# Patient Record
Sex: Male | Born: 1960 | Marital: Single | Smoking: Former smoker
Health system: Southern US, Community
[De-identification: ages and names within clinical notes are randomized; demographics above are authoritative.]

## PROBLEM LIST (undated history)

## (undated) DIAGNOSIS — I10 Essential (primary) hypertension: Secondary | ICD-10-CM

## (undated) DIAGNOSIS — Z8719 Personal history of other diseases of the digestive system: Secondary | ICD-10-CM

## (undated) DIAGNOSIS — D649 Anemia, unspecified: Secondary | ICD-10-CM

## (undated) DIAGNOSIS — K859 Acute pancreatitis without necrosis or infection, unspecified: Secondary | ICD-10-CM

## (undated) DIAGNOSIS — M1990 Unspecified osteoarthritis, unspecified site: Secondary | ICD-10-CM

## (undated) DIAGNOSIS — E119 Type 2 diabetes mellitus without complications: Secondary | ICD-10-CM

## (undated) HISTORY — PX: CARPAL TUNNEL RELEASE: SHX101

---

## 2008-08-12 HISTORY — PX: EYE SURGERY: SHX253

## 2011-08-13 HISTORY — PX: REPLACEMENT TOTAL KNEE: SUR1224

## 2012-08-12 HISTORY — PX: FINGER SURGERY: SHX640

## 2012-08-12 HISTORY — PX: ELBOW SURGERY: SHX618

## 2012-08-12 HISTORY — PX: KNEE ARTHROSCOPY: SUR90

## 2013-08-12 DIAGNOSIS — K859 Acute pancreatitis without necrosis or infection, unspecified: Secondary | ICD-10-CM

## 2013-08-12 HISTORY — DX: Acute pancreatitis without necrosis or infection, unspecified: K85.90

## 2015-02-20 ENCOUNTER — Other Ambulatory Visit: Payer: Self-pay | Admitting: Orthopedic Surgery

## 2015-02-20 DIAGNOSIS — M545 Low back pain: Secondary | ICD-10-CM

## 2015-02-22 ENCOUNTER — Other Ambulatory Visit: Payer: Self-pay | Admitting: Orthopedic Surgery

## 2015-02-22 DIAGNOSIS — M545 Low back pain: Secondary | ICD-10-CM

## 2015-03-01 ENCOUNTER — Ambulatory Visit
Admission: RE | Admit: 2015-03-01 | Discharge: 2015-03-01 | Disposition: A | Discharge status: Home / Self Care | Payer: Medicare Other | Source: Ambulatory Visit | Attending: Orthopedic Surgery | Admitting: Orthopedic Surgery

## 2015-03-01 DIAGNOSIS — M545 Low back pain: Secondary | ICD-10-CM

## 2015-03-07 ENCOUNTER — Ambulatory Visit
Admission: RE | Admit: 2015-03-07 | Discharge: 2015-03-07 | Disposition: A | Discharge status: Home / Self Care | Payer: Medicare Other | Source: Ambulatory Visit | Attending: Orthopedic Surgery | Admitting: Orthopedic Surgery

## 2015-03-07 DIAGNOSIS — M545 Low back pain: Secondary | ICD-10-CM

## 2015-03-07 MED ORDER — GADOBENATE DIMEGLUMINE 529 MG/ML IV SOLN
20.0000 mL | Freq: Once | INTRAVENOUS | Status: AC | PRN
  Administered 2015-03-07: 20 mL via INTRAVENOUS

## 2015-03-13 ENCOUNTER — Other Ambulatory Visit: Payer: Self-pay | Admitting: Orthopedic Surgery

## 2015-03-14 ENCOUNTER — Other Ambulatory Visit: Payer: Self-pay | Admitting: Orthopedic Surgery

## 2015-03-17 ENCOUNTER — Encounter (HOSPITAL_COMMUNITY): Payer: Self-pay

## 2015-03-17 ENCOUNTER — Ambulatory Visit (HOSPITAL_COMMUNITY)
Admission: RE | Admit: 2015-03-17 | Discharge: 2015-03-17 | Disposition: A | Discharge status: Home / Self Care | Payer: Medicare Other | Source: Ambulatory Visit | Attending: Orthopedic Surgery | Admitting: Orthopedic Surgery

## 2015-03-17 ENCOUNTER — Encounter (HOSPITAL_COMMUNITY)
Admission: RE | Admit: 2015-03-17 | Discharge: 2015-03-17 | Disposition: A | Discharge status: Home / Self Care | Payer: Medicare Other | Source: Ambulatory Visit | Attending: Orthopedic Surgery | Admitting: Orthopedic Surgery

## 2015-03-17 DIAGNOSIS — Z01818 Encounter for other preprocedural examination: Secondary | ICD-10-CM | POA: Diagnosis present

## 2015-03-17 DIAGNOSIS — Z01812 Encounter for preprocedural laboratory examination: Secondary | ICD-10-CM | POA: Insufficient documentation

## 2015-03-17 DIAGNOSIS — M5136 Other intervertebral disc degeneration, lumbar region: Secondary | ICD-10-CM | POA: Diagnosis not present

## 2015-03-17 DIAGNOSIS — M4316 Spondylolisthesis, lumbar region: Secondary | ICD-10-CM | POA: Insufficient documentation

## 2015-03-17 DIAGNOSIS — Z79899 Other long term (current) drug therapy: Secondary | ICD-10-CM | POA: Insufficient documentation

## 2015-03-17 DIAGNOSIS — R001 Bradycardia, unspecified: Secondary | ICD-10-CM | POA: Diagnosis not present

## 2015-03-17 DIAGNOSIS — I10 Essential (primary) hypertension: Secondary | ICD-10-CM | POA: Diagnosis not present

## 2015-03-17 DIAGNOSIS — Z87891 Personal history of nicotine dependence: Secondary | ICD-10-CM | POA: Diagnosis not present

## 2015-03-17 DIAGNOSIS — E119 Type 2 diabetes mellitus without complications: Secondary | ICD-10-CM | POA: Insufficient documentation

## 2015-03-17 DIAGNOSIS — Z0183 Encounter for blood typing: Secondary | ICD-10-CM | POA: Diagnosis not present

## 2015-03-17 HISTORY — DX: Essential (primary) hypertension: I10

## 2015-03-17 HISTORY — DX: Personal history of other diseases of the digestive system: Z87.19

## 2015-03-17 HISTORY — DX: Acute pancreatitis without necrosis or infection, unspecified: K85.90

## 2015-03-17 HISTORY — DX: Unspecified osteoarthritis, unspecified site: M19.90

## 2015-03-17 HISTORY — DX: Type 2 diabetes mellitus without complications: E11.9

## 2015-03-17 HISTORY — DX: Anemia, unspecified: D64.9

## 2015-03-17 LAB — TYPE AND SCREEN
ABO/RH(D): O POS
Antibody Screen: NEGATIVE

## 2015-03-17 LAB — CBC WITH DIFFERENTIAL/PLATELET
BASOS ABS: 0.1 10*3/uL (ref 0.0–0.1)
Basophils Relative: 1 % (ref 0–1)
Eosinophils Absolute: 0.3 10*3/uL (ref 0.0–0.7)
Eosinophils Relative: 4 % (ref 0–5)
HEMATOCRIT: 43.5 % (ref 39.0–52.0)
Hemoglobin: 14.7 g/dL (ref 13.0–17.0)
LYMPHS ABS: 1.3 10*3/uL (ref 0.7–4.0)
Lymphocytes Relative: 22 % (ref 12–46)
MCH: 34.7 pg — AB (ref 26.0–34.0)
MCHC: 33.8 g/dL (ref 30.0–36.0)
MCV: 102.6 fL — ABNORMAL HIGH (ref 78.0–100.0)
MONO ABS: 0.9 10*3/uL (ref 0.1–1.0)
MONOS PCT: 14 % — AB (ref 3–12)
Neutro Abs: 3.5 10*3/uL (ref 1.7–7.7)
Neutrophils Relative %: 59 % (ref 43–77)
Platelets: 194 10*3/uL (ref 150–400)
RBC: 4.24 MIL/uL (ref 4.22–5.81)
RDW: 13.3 % (ref 11.5–15.5)
WBC: 6.1 10*3/uL (ref 4.0–10.5)

## 2015-03-17 LAB — URINALYSIS, ROUTINE W REFLEX MICROSCOPIC
BILIRUBIN URINE: NEGATIVE
GLUCOSE, UA: NEGATIVE mg/dL
Hgb urine dipstick: NEGATIVE
KETONES UR: NEGATIVE mg/dL
LEUKOCYTES UA: NEGATIVE
NITRITE: NEGATIVE
PH: 5.5 (ref 5.0–8.0)
Protein, ur: NEGATIVE mg/dL
Specific Gravity, Urine: 1.014 (ref 1.005–1.030)
UROBILINOGEN UA: 0.2 mg/dL (ref 0.0–1.0)

## 2015-03-17 LAB — COMPREHENSIVE METABOLIC PANEL
ALT: 29 U/L (ref 17–63)
AST: 69 U/L — ABNORMAL HIGH (ref 15–41)
Albumin: 4 g/dL (ref 3.5–5.0)
Alkaline Phosphatase: 84 U/L (ref 38–126)
Anion gap: 12 (ref 5–15)
BUN: 11 mg/dL (ref 6–20)
CALCIUM: 9.5 mg/dL (ref 8.9–10.3)
CHLORIDE: 105 mmol/L (ref 101–111)
CO2: 20 mmol/L — AB (ref 22–32)
Creatinine, Ser: 1.45 mg/dL — ABNORMAL HIGH (ref 0.61–1.24)
GFR calc Af Amer: >60 mL/min (ref >60)
GFR, EST NON AFRICAN AMERICAN: 54 mL/min — AB (ref >60)
Glucose, Bld: 133 mg/dL — ABNORMAL HIGH (ref 65–99)
POTASSIUM: 6 mmol/L — AB (ref 3.5–5.1)
Sodium: 137 mmol/L (ref 135–145)
Total Bilirubin: 1 mg/dL (ref 0.3–1.2)
Total Protein: 8 g/dL (ref 6.5–8.1)

## 2015-03-17 LAB — SURGICAL PCR SCREEN
MRSA, PCR: NEGATIVE
Staphylococcus aureus: NEGATIVE

## 2015-03-17 LAB — GLUCOSE, CAPILLARY: GLUCOSE-CAPILLARY: 141 mg/dL — AB (ref 65–99)

## 2015-03-17 LAB — PROTIME-INR
INR: 1.22 (ref 0.00–1.49)
Prothrombin Time: 15.6 seconds — ABNORMAL HIGH (ref 11.6–15.2)

## 2015-03-17 LAB — ABO/RH: ABO/RH(D): O POS

## 2015-03-17 LAB — APTT: aPTT: 27 seconds (ref 24–37)

## 2015-03-17 NOTE — Progress Notes (Signed)
   03/17/15 1210  OBSTRUCTIVE SLEEP APNEA  Have you ever been diagnosed with sleep apnea through a sleep study? No  Do you snore loudly (loud enough to be heard through closed doors)?  1  Do you often feel tired, fatigued, or sleepy during the daytime? 0  Has anyone observed you stop breathing during your sleep? 0  Do you have, or are you being treated for high blood pressure? 1  BMI more than 35 kg/m2? 1  Age over 52 years old? 1  Neck circumference greater than 40 cm/16 inches? 1  Gender: 1

## 2015-03-17 NOTE — Progress Notes (Addendum)
PCP: [facility] in [place] ,[place]  Faxed over Sleep apnea screening tool.  No cardiologist. States fasting blood sugars are 120's.  2015 stress test at [facility], negative,dx. with pancreatitis-resolved --will request test/ekg

## 2015-03-17 NOTE — Progress Notes (Addendum)
Anesthesia Chart Review:  Pt is 54 year old male scheduled for L4-5, L5-S1 transforaminal lumbar interbody fusion, L4-5, L5S1 decompression on 03/22/2015 with Dr. Yevette Edwards.   PMH includes: HTN, DM, pancreatitis, anemia. Former smoker. BMI 37.5.   Medications include: amlodipine, atenolol, crestor, levemir, losartan, protonix.   Preoperative labs reviewed.  K 6.0. Notified Carla in Dr. Marshell Levan office.    Chest x-ray 03/17/2015 reviewed. No active cardiopulmonary disease.   EKG 03/17/2015: sinus bradycardia (57 bpm).   Pt reportedly had stress test at [facility] in 2015. Attempting to get records.   Rica Mast, FNP-BC [facility]/Anesthesiology Phone: [phone] 03/17/2015 4:57 PM  Addendum: Dr. Yevette Edwards had pt repeat BMET today (8/8). K is now 4.5. Cr is 1.6. Lab results forwarded to Dr. Yevette Edwards for review and Albin Felling in Dr. Marshell Levan office notified.   Rica Mast, FNP-BC [facility] Stay [facility]/Anesthesiology Phone: [phone] 03/20/2015 1:52 PM  Addendum: Nuclear stress test 01/26/2013 (see care everywhere; printed copy on paper chart): 1. No scintigraphic evidence of prior infarction or pharmacologically-induced ischemia 2. Normal wall motion. EF 73%  If no changes, I anticipate pt can proceed with surgery as scheduled.   Rica Mast, FNP-BC [facility] Stay Surgical Center/Anesthesiology Phone: [phone] 03/21/2015 4:36 PM

## 2015-03-17 NOTE — Pre-Procedure Instructions (Signed)
Alec Gibbs  03/17/2015      [id number] - [place], [place] - [address] AT [facility] [address] [place] [address] Phone: [phone] Fax: [phone]    Your procedure is scheduled on Wednesday, Aug. 10  Report to [facility] Main Entrance "A" at 6:30 A.M.  Call this number if you have problems the morning of surgery:  [phone]              Any questions prior to surgery call [phone] Monday-Friday 8am-4pm   Remember:  Do not eat food or drink liquids after midnight on Tuesday 8/09  Take these medicines the morning of surgery with A SIP OF WATER: allourinol, norvasc, atenolol (tenormin), colchicine, oxycodone if needed, protonix  How to Manage Your Diabetes Before Surgery   Why is it important to control my blood sugar before and after surgery?   Improving blood sugar levels before and after surgery helps healing and can limit problems.  A way of improving blood sugar control is eating a healthy diet by:  - Eating less sugar and carbohydrates  - Increasing activity/exercise  - Talk with your doctor about reaching your blood sugar goals  High blood sugars (greater than 180 mg/dL) can raise your risk of infections and slow down your recovery so you will need to focus on controlling your diabetes during the weeks before surgery.  Make sure that the doctor who takes care of your diabetes knows about your planned surgery including the date and location.  How do I manage my blood sugars before surgery?   Check your blood sugar at least 4 times a day, 2 days before surgery to make sure that they are not too high or low.    Check your blood sugar the morning of your surgery when you wake up and every 2               hours until you get to the Short-Stay unit.   Treat a low blood sugar (less than 70 mg/dL) with 1/2 cup of clear juice (cranberry or apple), 4 glucose tablets, OR glucose gel.   Recheck blood sugar in 15  minutes after treatment (to make sure it is greater than 70 mg/dL).  If blood sugar is not greater than 70 mg/dL on re-check, call [phone] for further instructions.    Report your blood sugar to the Short-Stay nurse when you get to Short-Stay.  References:  [facility], 2007 "How to Manage your Diabetes Before and After Surgery".  What do I do about my diabetes medications?    THE NIGHT BEFORE SURGERY, take 16 units of levemir  Insulin    Do not wear jewelry, make-up or nail polish.  Do not wear lotions, powders, or perfumes.  You may wear deodorant.  Do not shave 48 hours prior to surgery.  Men may shave face and neck.  Do not bring valuables to the hospital.  [facility] is not responsible for any belongings or valuables.  Contacts, dentures or bridgework may not be worn into surgery.  Leave your suitcase in the car.  After surgery it may be brought to your room.  For patients admitted to the hospital, discharge time will be determined by your treatment team.  Patients discharged the day of surgery will not be allowed to drive home.   Name and phone number of your driver:    Special instructions:  Review  preparing for surgery handout  Please read over the following fact sheets that you were given. Pain Booklet, Coughing and Deep Breathing, Blood Transfusion Information, MRSA Information and Surgical Site Infection Prevention

## 2015-03-18 LAB — HEMOGLOBIN A1C
Hgb A1c MFr Bld: 7.9 % — ABNORMAL HIGH (ref 4.8–5.6)
MEAN PLASMA GLUCOSE: 180 mg/dL

## 2015-03-20 ENCOUNTER — Inpatient Hospital Stay (HOSPITAL_COMMUNITY): Admission: RE | Admit: 2015-03-20 | Payer: Medicare Other | Source: Ambulatory Visit

## 2015-03-20 LAB — BASIC METABOLIC PANEL
Anion gap: 12 (ref 5–15)
BUN: 17 mg/dL (ref 6–20)
CO2: 20 mmol/L — ABNORMAL LOW (ref 22–32)
Calcium: 9.3 mg/dL (ref 8.9–10.3)
Chloride: 104 mmol/L (ref 101–111)
Creatinine, Ser: 1.6 mg/dL — ABNORMAL HIGH (ref 0.61–1.24)
GFR calc non Af Amer: 48 mL/min — ABNORMAL LOW (ref >60)
GFR, EST AFRICAN AMERICAN: 56 mL/min — AB (ref >60)
Glucose, Bld: 139 mg/dL — ABNORMAL HIGH (ref 65–99)
Potassium: 4.5 mmol/L (ref 3.5–5.1)
SODIUM: 136 mmol/L (ref 135–145)

## 2015-03-21 ENCOUNTER — Other Ambulatory Visit: Payer: Self-pay | Admitting: Orthopedic Surgery

## 2015-03-21 MED ORDER — CEFAZOLIN SODIUM 10 G IJ SOLR
3.0000 g | INTRAMUSCULAR | Status: AC
  Administered 2015-03-22 (×2): 3 g via INTRAVENOUS
  Filled 2015-03-21: qty 3000

## 2015-03-22 ENCOUNTER — Encounter (HOSPITAL_COMMUNITY)
Admission: AD | Disposition: A | Discharge status: Home / Self Care | Payer: Medicare Other | Source: Ambulatory Visit | Attending: Orthopedic Surgery

## 2015-03-22 ENCOUNTER — Inpatient Hospital Stay (HOSPITAL_COMMUNITY): Payer: Medicare Other

## 2015-03-22 ENCOUNTER — Inpatient Hospital Stay (HOSPITAL_COMMUNITY): Payer: Medicare Other | Admitting: Emergency Medicine

## 2015-03-22 ENCOUNTER — Inpatient Hospital Stay (HOSPITAL_COMMUNITY): Payer: Medicare Other | Admitting: Anesthesiology

## 2015-03-22 ENCOUNTER — Inpatient Hospital Stay (HOSPITAL_COMMUNITY)
Admission: AD | Admit: 2015-03-22 | Discharge: 2015-03-24 | DRG: 460 | Disposition: A | Discharge status: Home / Self Care | Payer: Medicare Other | Source: Ambulatory Visit | Attending: Orthopedic Surgery | Admitting: Orthopedic Surgery

## 2015-03-22 ENCOUNTER — Encounter (HOSPITAL_COMMUNITY): Payer: Self-pay | Admitting: Anesthesiology

## 2015-03-22 DIAGNOSIS — M79606 Pain in leg, unspecified: Secondary | ICD-10-CM | POA: Diagnosis present

## 2015-03-22 DIAGNOSIS — M62838 Other muscle spasm: Secondary | ICD-10-CM | POA: Diagnosis not present

## 2015-03-22 DIAGNOSIS — N365 Urethral false passage: Secondary | ICD-10-CM | POA: Diagnosis present

## 2015-03-22 DIAGNOSIS — Z96652 Presence of left artificial knee joint: Secondary | ICD-10-CM | POA: Diagnosis present

## 2015-03-22 DIAGNOSIS — I10 Essential (primary) hypertension: Secondary | ICD-10-CM | POA: Diagnosis present

## 2015-03-22 DIAGNOSIS — M4317 Spondylolisthesis, lumbosacral region: Secondary | ICD-10-CM | POA: Diagnosis present

## 2015-03-22 DIAGNOSIS — Z87891 Personal history of nicotine dependence: Secondary | ICD-10-CM | POA: Diagnosis not present

## 2015-03-22 DIAGNOSIS — M5410 Radiculopathy, site unspecified: Secondary | ICD-10-CM | POA: Diagnosis present

## 2015-03-22 DIAGNOSIS — N359 Urethral stricture, unspecified: Secondary | ICD-10-CM | POA: Diagnosis present

## 2015-03-22 DIAGNOSIS — Z79899 Other long term (current) drug therapy: Secondary | ICD-10-CM | POA: Diagnosis not present

## 2015-03-22 DIAGNOSIS — Z794 Long term (current) use of insulin: Secondary | ICD-10-CM

## 2015-03-22 DIAGNOSIS — E119 Type 2 diabetes mellitus without complications: Secondary | ICD-10-CM | POA: Diagnosis present

## 2015-03-22 DIAGNOSIS — G9589 Other specified diseases of spinal cord: Secondary | ICD-10-CM | POA: Diagnosis present

## 2015-03-22 DIAGNOSIS — M4807 Spinal stenosis, lumbosacral region: Principal | ICD-10-CM | POA: Diagnosis present

## 2015-03-22 DIAGNOSIS — Z419 Encounter for procedure for purposes other than remedying health state, unspecified: Secondary | ICD-10-CM

## 2015-03-22 HISTORY — PX: CYSTOSCOPY: SHX5120

## 2015-03-22 HISTORY — PX: CYSTOSCOPY WITH URETHRAL DILATATION: SHX5125

## 2015-03-22 LAB — GLUCOSE, CAPILLARY
Glucose-Capillary: 140 mg/dL — ABNORMAL HIGH (ref 65–99)
Glucose-Capillary: 127 mg/dL — ABNORMAL HIGH (ref 65–99)
Glucose-Capillary: 137 mg/dL — ABNORMAL HIGH (ref 65–99)

## 2015-03-22 SURGERY — POSTERIOR LUMBAR FUSION 1 LEVEL
Anesthesia: General | Site: Urethra | Laterality: Left

## 2015-03-22 MED ORDER — FENTANYL CITRATE (PF) 250 MCG/5ML IJ SOLN
INTRAMUSCULAR | Status: AC
  Filled 2015-03-22: qty 5

## 2015-03-22 MED ORDER — ATENOLOL 100 MG PO TABS
100.0000 mg | ORAL_TABLET | Freq: Every day | ORAL | Status: DC
  Administered 2015-03-23: 100 mg via ORAL
  Filled 2015-03-22 (×2): qty 1

## 2015-03-22 MED ORDER — PROMETHAZINE HCL 25 MG/ML IJ SOLN: 6.2500 mg | INTRAMUSCULAR | Status: DC | PRN

## 2015-03-22 MED ORDER — LOSARTAN POTASSIUM 25 MG PO TABS
25.0000 mg | ORAL_TABLET | Freq: Every day | ORAL | Status: DC
  Administered 2015-03-23: 25 mg via ORAL
  Filled 2015-03-22 (×2): qty 1

## 2015-03-22 MED ORDER — SODIUM CHLORIDE 0.9 % IJ SOLN
3.0000 mL | Freq: Two times a day (BID) | INTRAMUSCULAR | Status: DC
  Administered 2015-03-22: 3 mL via INTRAVENOUS

## 2015-03-22 MED ORDER — ALBUMIN HUMAN 5 % IV SOLN
INTRAVENOUS | Status: DC | PRN
  Administered 2015-03-22 (×2): via INTRAVENOUS

## 2015-03-22 MED ORDER — EPHEDRINE SULFATE 50 MG/ML IJ SOLN
INTRAMUSCULAR | Status: AC
  Filled 2015-03-22: qty 1

## 2015-03-22 MED ORDER — ALUM & MAG HYDROXIDE-SIMETH 200-200-20 MG/5ML PO SUSP: 30.0000 mL | Freq: Four times a day (QID) | ORAL | Status: DC | PRN

## 2015-03-22 MED ORDER — THROMBIN 20000 UNITS EX KIT
PACK | CUTANEOUS | Status: DC | PRN
  Administered 2015-03-22: 20000 [IU] via TOPICAL

## 2015-03-22 MED ORDER — MORPHINE SULFATE 2 MG/ML IJ SOLN: 1.0000 mg | INTRAMUSCULAR | Status: DC | PRN

## 2015-03-22 MED ORDER — SUCCINYLCHOLINE CHLORIDE 20 MG/ML IJ SOLN
INTRAMUSCULAR | Status: AC
  Filled 2015-03-22: qty 1

## 2015-03-22 MED ORDER — LIDOCAINE HCL (CARDIAC) 20 MG/ML IV SOLN
INTRAVENOUS | Status: AC
  Filled 2015-03-22: qty 5

## 2015-03-22 MED ORDER — LIDOCAINE HCL (CARDIAC) 20 MG/ML IV SOLN
INTRAVENOUS | Status: DC | PRN
  Administered 2015-03-22: 60 mg via INTRAVENOUS

## 2015-03-22 MED ORDER — SODIUM CHLORIDE 0.9 % IV SOLN
INTRAVENOUS | Status: DC
  Administered 2015-03-22: 75 mL/h via INTRAVENOUS

## 2015-03-22 MED ORDER — SODIUM CHLORIDE 0.9 % IV SOLN
INTRAVENOUS | Status: DC | PRN
  Administered 2015-03-22: 15:00:00 via INTRAVENOUS

## 2015-03-22 MED ORDER — ALLOPURINOL 100 MG PO TABS
100.0000 mg | ORAL_TABLET | Freq: Three times a day (TID) | ORAL | Status: DC
  Filled 2015-03-22 (×8): qty 1

## 2015-03-22 MED ORDER — ROSUVASTATIN CALCIUM 40 MG PO TABS
40.0000 mg | ORAL_TABLET | Freq: Every day | ORAL | Status: DC
Start: 2015-03-23 — End: 2015-03-24
  Filled 2015-03-22 (×2): qty 1
  Filled 2015-03-22: qty 2

## 2015-03-22 MED ORDER — POVIDONE-IODINE 7.5 % EX SOLN: Freq: Once | CUTANEOUS | Status: DC

## 2015-03-22 MED ORDER — COLCHICINE 0.6 MG PO TABS
0.6000 mg | ORAL_TABLET | Freq: Every day | ORAL | Status: DC
  Administered 2015-03-23: 0.6 mg via ORAL
  Filled 2015-03-22 (×2): qty 1

## 2015-03-22 MED ORDER — HYDROMORPHONE HCL 1 MG/ML IJ SOLN
0.2500 mg | INTRAMUSCULAR | Status: DC | PRN
  Administered 2015-03-22 (×2): 0.5 mg via INTRAVENOUS

## 2015-03-22 MED ORDER — 0.9 % SODIUM CHLORIDE (POUR BTL) OPTIME
TOPICAL | Status: DC | PRN
  Administered 2015-03-22: 4000 mL

## 2015-03-22 MED ORDER — ACETAMINOPHEN 325 MG PO TABS: 650.0000 mg | ORAL_TABLET | ORAL | Status: DC | PRN

## 2015-03-22 MED ORDER — ARTIFICIAL TEARS OP OINT
TOPICAL_OINTMENT | OPHTHALMIC | Status: DC | PRN
  Administered 2015-03-22: 1 via OPHTHALMIC

## 2015-03-22 MED ORDER — BISACODYL 5 MG PO TBEC
5.0000 mg | DELAYED_RELEASE_TABLET | Freq: Every day | ORAL | Status: DC | PRN
Start: 2015-03-22 — End: 2015-03-24
  Administered 2015-03-24: 5 mg via ORAL
  Filled 2015-03-22: qty 1

## 2015-03-22 MED ORDER — DEXTROSE 5 % IV SOLN
3.0000 g | INTRAVENOUS | Status: DC
  Filled 2015-03-22: qty 3000

## 2015-03-22 MED ORDER — AMLODIPINE BESYLATE 10 MG PO TABS
10.0000 mg | ORAL_TABLET | Freq: Every day | ORAL | Status: DC
  Filled 2015-03-22 (×2): qty 1

## 2015-03-22 MED ORDER — DOCUSATE SODIUM 100 MG PO CAPS
100.0000 mg | ORAL_CAPSULE | Freq: Two times a day (BID) | ORAL | Status: DC
  Administered 2015-03-22 – 2015-03-23 (×3): 100 mg via ORAL
  Filled 2015-03-22 (×2): qty 1

## 2015-03-22 MED ORDER — IOHEXOL 300 MG/ML  SOLN
INTRAMUSCULAR | Status: DC | PRN
  Administered 2015-03-22: 25 mL via URETHRAL

## 2015-03-22 MED ORDER — OXYCODONE-ACETAMINOPHEN 5-325 MG PO TABS
1.0000 | ORAL_TABLET | ORAL | Status: DC | PRN
  Administered 2015-03-22 – 2015-03-24 (×6): 2 via ORAL
  Filled 2015-03-22 (×6): qty 2

## 2015-03-22 MED ORDER — PROPOFOL INFUSION 10 MG/ML OPTIME
INTRAVENOUS | Status: DC | PRN
  Administered 2015-03-22: 50 ug/kg/min via INTRAVENOUS

## 2015-03-22 MED ORDER — HYDROMORPHONE HCL 1 MG/ML IJ SOLN
INTRAMUSCULAR | Status: AC
  Administered 2015-03-22: 0.5 mg via INTRAVENOUS
  Filled 2015-03-22: qty 1

## 2015-03-22 MED ORDER — VECURONIUM BROMIDE 10 MG IV SOLR
INTRAVENOUS | Status: DC | PRN
  Administered 2015-03-22 (×2): 2000 ug via INTRAVENOUS
  Administered 2015-03-22: 4000 ug via INTRAVENOUS
  Administered 2015-03-22 (×2): 2000 ug via INTRAVENOUS

## 2015-03-22 MED ORDER — SUCCINYLCHOLINE CHLORIDE 20 MG/ML IJ SOLN
INTRAMUSCULAR | Status: DC | PRN
  Administered 2015-03-22: 120 mg via INTRAVENOUS

## 2015-03-22 MED ORDER — OXYCODONE HCL 5 MG PO TABS: 5.0000 mg | ORAL_TABLET | Freq: Once | ORAL | Status: DC | PRN

## 2015-03-22 MED ORDER — DIAZEPAM 5 MG PO TABS
5.0000 mg | ORAL_TABLET | Freq: Four times a day (QID) | ORAL | Status: DC | PRN
  Administered 2015-03-22 – 2015-03-23 (×2): 5 mg via ORAL
  Filled 2015-03-22 (×3): qty 1

## 2015-03-22 MED ORDER — DEXTROSE 5 % IV SOLN
INTRAVENOUS | Status: DC | PRN
  Administered 2015-03-22: 11:00:00 via INTRAVENOUS

## 2015-03-22 MED ORDER — ACETAMINOPHEN 650 MG RE SUPP: 650.0000 mg | RECTAL | Status: DC | PRN

## 2015-03-22 MED ORDER — BUPIVACAINE-EPINEPHRINE (PF) 0.25% -1:200000 IJ SOLN
INTRAMUSCULAR | Status: AC
  Filled 2015-03-22: qty 30

## 2015-03-22 MED ORDER — METHYLENE BLUE 1 % INJ SOLN
INTRAMUSCULAR | Status: AC
  Filled 2015-03-22: qty 10

## 2015-03-22 MED ORDER — FLEET ENEMA 7-19 GM/118ML RE ENEM: 1.0000 | ENEMA | Freq: Once | RECTAL | Status: DC | PRN

## 2015-03-22 MED ORDER — FENTANYL CITRATE (PF) 100 MCG/2ML IJ SOLN
INTRAMUSCULAR | Status: DC | PRN
  Administered 2015-03-22 (×3): 50 ug via INTRAVENOUS
  Administered 2015-03-22: 150 ug via INTRAVENOUS
  Administered 2015-03-22: 100 ug via INTRAVENOUS
  Administered 2015-03-22 (×2): 50 ug via INTRAVENOUS

## 2015-03-22 MED ORDER — ARTIFICIAL TEARS OP OINT
TOPICAL_OINTMENT | OPHTHALMIC | Status: AC
  Filled 2015-03-22: qty 3.5

## 2015-03-22 MED ORDER — EPHEDRINE SULFATE 50 MG/ML IJ SOLN
INTRAMUSCULAR | Status: DC | PRN
  Administered 2015-03-22: 20 mg via INTRAVENOUS

## 2015-03-22 MED ORDER — VECURONIUM BROMIDE 10 MG IV SOLR
INTRAVENOUS | Status: AC
  Filled 2015-03-22: qty 20

## 2015-03-22 MED ORDER — SODIUM CHLORIDE 0.9 % IJ SOLN: 3.0000 mL | INTRAMUSCULAR | Status: DC | PRN

## 2015-03-22 MED ORDER — INSULIN DETEMIR 100 UNIT/ML ~~LOC~~ SOLN
20.0000 [IU] | Freq: Every evening | SUBCUTANEOUS | Status: DC
  Filled 2015-03-22 (×4): qty 0.2

## 2015-03-22 MED ORDER — STERILE WATER FOR INJECTION IJ SOLN
INTRAMUSCULAR | Status: AC
  Filled 2015-03-22: qty 20

## 2015-03-22 MED ORDER — PROPOFOL 10 MG/ML IV BOLUS
INTRAVENOUS | Status: DC | PRN
  Administered 2015-03-22: 200 mg via INTRAVENOUS

## 2015-03-22 MED ORDER — OXYCODONE HCL 5 MG/5ML PO SOLN: 5.0000 mg | Freq: Once | ORAL | Status: DC | PRN

## 2015-03-22 MED ORDER — LACTATED RINGERS IV SOLN
INTRAVENOUS | Status: DC | PRN
  Administered 2015-03-22 (×3): via INTRAVENOUS

## 2015-03-22 MED ORDER — PANTOPRAZOLE SODIUM 40 MG PO TBEC
40.0000 mg | DELAYED_RELEASE_TABLET | Freq: Two times a day (BID) | ORAL | Status: DC | PRN
  Administered 2015-03-22 – 2015-03-23 (×2): 40 mg via ORAL
  Filled 2015-03-22 (×2): qty 1

## 2015-03-22 MED ORDER — METHYLENE BLUE 1 % INJ SOLN
INTRAMUSCULAR | Status: DC | PRN
  Administered 2015-03-22: 100 mg via INTRAVENOUS

## 2015-03-22 MED ORDER — THROMBIN 20000 UNITS EX SOLR
CUTANEOUS | Status: AC
  Filled 2015-03-22: qty 20000

## 2015-03-22 MED ORDER — SODIUM CHLORIDE 0.9 % IV SOLN
10.0000 mg | INTRAVENOUS | Status: DC | PRN
  Administered 2015-03-22: 20 ug/min via INTRAVENOUS

## 2015-03-22 MED ORDER — NEOSTIGMINE METHYLSULFATE 10 MG/10ML IV SOLN: INTRAVENOUS | Status: DC | PRN

## 2015-03-22 MED ORDER — MIDAZOLAM HCL 5 MG/5ML IJ SOLN
INTRAMUSCULAR | Status: DC | PRN
  Administered 2015-03-22: 2 mg via INTRAVENOUS

## 2015-03-22 MED ORDER — ONDANSETRON HCL 4 MG/2ML IJ SOLN: 4.0000 mg | INTRAMUSCULAR | Status: DC | PRN

## 2015-03-22 MED ORDER — CIPROFLOXACIN IN D5W 400 MG/200ML IV SOLN
400.0000 mg | INTRAVENOUS | Status: AC
  Administered 2015-03-22: 400 mg via INTRAVENOUS
  Filled 2015-03-22: qty 200

## 2015-03-22 MED ORDER — SODIUM CHLORIDE 0.9 % IJ SOLN
INTRAMUSCULAR | Status: AC
  Filled 2015-03-22: qty 20

## 2015-03-22 MED ORDER — MIDAZOLAM HCL 2 MG/2ML IJ SOLN
INTRAMUSCULAR | Status: AC
  Filled 2015-03-22: qty 4

## 2015-03-22 MED ORDER — SODIUM CHLORIDE 0.9 % IV BOLUS (SEPSIS)
1000.0000 mL | Freq: Once | INTRAVENOUS | Status: AC
  Administered 2015-03-22: 1000 mL via INTRAVENOUS

## 2015-03-22 MED ORDER — PHENYLEPHRINE HCL 10 MG/ML IJ SOLN
INTRAMUSCULAR | Status: DC | PRN
  Administered 2015-03-22: 160 ug via INTRAVENOUS

## 2015-03-22 MED ORDER — PHENOL 1.4 % MT LIQD: 1.0000 | OROMUCOSAL | Status: DC | PRN

## 2015-03-22 MED ORDER — CIPROFLOXACIN IN D5W 400 MG/200ML IV SOLN: INTRAVENOUS | Status: DC | PRN

## 2015-03-22 MED ORDER — ROCURONIUM BROMIDE 100 MG/10ML IV SOLN
INTRAVENOUS | Status: DC | PRN
  Administered 2015-03-22: 50 mg via INTRAVENOUS

## 2015-03-22 MED ORDER — BUPIVACAINE-EPINEPHRINE 0.25% -1:200000 IJ SOLN
INTRAMUSCULAR | Status: DC | PRN
  Administered 2015-03-22: 20 mL

## 2015-03-22 MED ORDER — MENTHOL 3 MG MT LOZG: 1.0000 | LOZENGE | OROMUCOSAL | Status: DC | PRN

## 2015-03-22 MED ORDER — SODIUM CHLORIDE 0.9 % IV SOLN
250.0000 mL | INTRAVENOUS | Status: DC
  Administered 2015-03-22: 250 mL via INTRAVENOUS

## 2015-03-22 MED ORDER — CEFAZOLIN SODIUM 1-5 GM-% IV SOLN
1.0000 g | Freq: Three times a day (TID) | INTRAVENOUS | Status: AC
  Administered 2015-03-22 – 2015-03-23 (×2): 1 g via INTRAVENOUS
  Filled 2015-03-22 (×2): qty 50

## 2015-03-22 MED ORDER — OXYCODONE-ACETAMINOPHEN 5-325 MG PO TABS: 1.0000 | ORAL_TABLET | Freq: Three times a day (TID) | ORAL | Status: DC | PRN

## 2015-03-22 MED ORDER — GLYCOPYRROLATE 0.2 MG/ML IJ SOLN: INTRAMUSCULAR | Status: DC | PRN

## 2015-03-22 MED ORDER — SENNOSIDES-DOCUSATE SODIUM 8.6-50 MG PO TABS
1.0000 | ORAL_TABLET | Freq: Every evening | ORAL | Status: DC | PRN
  Administered 2015-03-24: 1 via ORAL
  Filled 2015-03-22: qty 1

## 2015-03-22 MED ORDER — ONDANSETRON HCL 4 MG/2ML IJ SOLN
INTRAMUSCULAR | Status: DC | PRN
  Administered 2015-03-22 (×2): 4 mg via INTRAVENOUS

## 2015-03-22 SURGICAL SUPPLY — 99 items
BAG URINE DRAINAGE (UROLOGICAL SUPPLIES) ×4 IMPLANT
BAG URO CATCHER STRL LF (DRAPE) ×4 IMPLANT
BALLN NEPHROSTOMY (BALLOONS) ×4
BALLOON NEPHROSTOMY (BALLOONS) ×2 IMPLANT
BENZOIN TINCTURE PRP APPL 2/3 (GAUZE/BANDAGES/DRESSINGS) ×4 IMPLANT
BLADE SURG ROTATE 9660 (MISCELLANEOUS) IMPLANT
BUR PRESCISION 1.7 ELITE (BURR) IMPLANT
BUR ROUND PRECISION 4.0 (BURR) IMPLANT
BUR ROUND PRECISION 4.0MM (BURR)
BUR SABER RD CUTTING 3.0 (BURR) IMPLANT
BUR SABER RD CUTTING 3.0MM (BURR)
CAGE CONCORDE BULLET 11X12X27 (Cage) ×2 IMPLANT
CAGE SPNL PRLL BLT NOSE 27X11 (Cage) ×2 IMPLANT
CARTRIDGE OIL MAESTRO DRILL (MISCELLANEOUS) ×2 IMPLANT
CATH COUDE FOLEY 2W 5CC 16FR (CATHETERS) ×4 IMPLANT
CATH FOLEY 2W COUNCIL 5CC 16FR (CATHETERS) ×4 IMPLANT
CLOSURE STERI-STRIP 1/2X4 (GAUZE/BANDAGES/DRESSINGS) ×1
CLOSURE WOUND 1/2 X4 (GAUZE/BANDAGES/DRESSINGS) ×2
CLSR STERI-STRIP ANTIMIC 1/2X4 (GAUZE/BANDAGES/DRESSINGS) ×3 IMPLANT
CONT SPEC 4OZ CLIKSEAL STRL BL (MISCELLANEOUS) ×4 IMPLANT
CONT SPEC STER OR (MISCELLANEOUS) ×4 IMPLANT
COVER MAYO STAND STRL (DRAPES) ×16 IMPLANT
COVER SURGICAL LIGHT HANDLE (MISCELLANEOUS) ×4 IMPLANT
DIFFUSER DRILL AIR PNEUMATIC (MISCELLANEOUS) ×4 IMPLANT
DISPOSABLE PEDICLE SCREW PROBE ×4 IMPLANT
DRAIN CHANNEL 15F RND FF W/TCR (WOUND CARE) IMPLANT
DRAPE C-ARM 42X72 X-RAY (DRAPES) ×16 IMPLANT
DRAPE C-ARMOR (DRAPES) IMPLANT
DRAPE POUCH INSTRU U-SHP 10X18 (DRAPES) ×4 IMPLANT
DRAPE SURG 17X23 STRL (DRAPES) ×16 IMPLANT
DURAPREP 26ML APPLICATOR (WOUND CARE) ×4 IMPLANT
ELECT BLADE 4.0 EZ CLEAN MEGAD (MISCELLANEOUS) ×4
ELECT CAUTERY BLADE 6.4 (BLADE) ×4 IMPLANT
ELECT REM PT RETURN 9FT ADLT (ELECTROSURGICAL) ×4
ELECTRODE BLDE 4.0 EZ CLN MEGD (MISCELLANEOUS) ×2 IMPLANT
ELECTRODE REM PT RTRN 9FT ADLT (ELECTROSURGICAL) ×2 IMPLANT
EVACUATOR SILICONE 100CC (DRAIN) ×4 IMPLANT
GAUZE SPONGE 4X4 12PLY STRL (GAUZE/BANDAGES/DRESSINGS) ×4 IMPLANT
GAUZE SPONGE 4X4 16PLY XRAY LF (GAUZE/BANDAGES/DRESSINGS) ×16 IMPLANT
GLOVE BIO SURGEON STRL SZ7 (GLOVE) ×8 IMPLANT
GLOVE BIO SURGEON STRL SZ8 (GLOVE) ×4 IMPLANT
GLOVE BIOGEL PI IND STRL 7.0 (GLOVE) ×2 IMPLANT
GLOVE BIOGEL PI IND STRL 8 (GLOVE) ×2 IMPLANT
GLOVE BIOGEL PI INDICATOR 7.0 (GLOVE) ×2
GLOVE BIOGEL PI INDICATOR 8 (GLOVE) ×2
GLOVE ECLIPSE 7.5 STRL STRAW (GLOVE) ×4 IMPLANT
GOWN STRL REUS W/ TWL LRG LVL3 (GOWN DISPOSABLE) ×4 IMPLANT
GOWN STRL REUS W/ TWL XL LVL3 (GOWN DISPOSABLE) ×2 IMPLANT
GOWN STRL REUS W/TWL LRG LVL3 (GOWN DISPOSABLE) ×4
GOWN STRL REUS W/TWL XL LVL3 (GOWN DISPOSABLE) ×2
GUIDEWIRE STR DUAL SENSOR (WIRE) ×8 IMPLANT
IV CATH 14GX2 1/4 (CATHETERS) ×4 IMPLANT
KIT BASIN OR (CUSTOM PROCEDURE TRAY) ×4 IMPLANT
KIT POSITION SURG JACKSON T1 (MISCELLANEOUS) ×4 IMPLANT
KIT ROOM TURNOVER OR (KITS) ×4 IMPLANT
MARKER SKIN DUAL TIP RULER LAB (MISCELLANEOUS) ×4 IMPLANT
MATRIX HEMOSTAT SURGIFLO (HEMOSTASIS) ×4 IMPLANT
MIX DBX 10CC 35% BONE (Bone Implant) ×4 IMPLANT
NDL SAFETY ECLIPSE 18X1.5 (NEEDLE) ×2 IMPLANT
NEEDLE 22X1 1/2 (OR ONLY) (NEEDLE) ×4 IMPLANT
NEEDLE HYPO 18GX1.5 SHARP (NEEDLE) ×2
NEEDLE HYPO 25GX1X1/2 BEV (NEEDLE) ×4 IMPLANT
NEEDLE SPNL 18GX3.5 QUINCKE PK (NEEDLE) ×8 IMPLANT
NS IRRIG 1000ML POUR BTL (IV SOLUTION) ×4 IMPLANT
OIL CARTRIDGE MAESTRO DRILL (MISCELLANEOUS) ×4
OPEN END URETERAL CATHETER ×4 IMPLANT
PACK LAMINECTOMY ORTHO (CUSTOM PROCEDURE TRAY) ×4 IMPLANT
PACK UNIVERSAL I (CUSTOM PROCEDURE TRAY) ×4 IMPLANT
PAD ARMBOARD 7.5X6 YLW CONV (MISCELLANEOUS) ×8 IMPLANT
PATTIES SURGICAL .5 X1 (DISPOSABLE) ×4 IMPLANT
PATTIES SURGICAL .5X1.5 (GAUZE/BANDAGES/DRESSINGS) ×4 IMPLANT
PROBE PEDCLE PROBE MAGSTM DISP (MISCELLANEOUS) ×4 IMPLANT
ROD PRE BENT EXP 40MM (Rod) ×4 IMPLANT
ROD PRE BENT EXPEDIUM 35MM (Rod) ×4 IMPLANT
SCREW EXPEDIUM POLYAXIAL 6X45M (Screw) ×8 IMPLANT
SCREW EXPEDIUM POLYAXIAL 7X40M (Screw) ×8 IMPLANT
SCREW SET SINGLE INNER (Screw) ×16 IMPLANT
SET CYSTO W/LG BORE CLAMP LF (SET/KITS/TRAYS/PACK) ×4 IMPLANT
SPONGE GAUZE 4X4 12PLY STER LF (GAUZE/BANDAGES/DRESSINGS) ×4 IMPLANT
SPONGE INTESTINAL PEANUT (DISPOSABLE) ×4 IMPLANT
SPONGE SURGIFOAM ABS GEL 100 (HEMOSTASIS) ×4 IMPLANT
STRIP CLOSURE SKIN 1/2X4 (GAUZE/BANDAGES/DRESSINGS) ×6 IMPLANT
SURGIFLO W/THROMBIN 8M KIT (HEMOSTASIS) IMPLANT
SUT MNCRL AB 4-0 PS2 18 (SUTURE) ×8 IMPLANT
SUT VIC AB 0 CT1 18XCR BRD 8 (SUTURE) ×2 IMPLANT
SUT VIC AB 0 CT1 8-18 (SUTURE) ×2
SUT VIC AB 1 CT1 18XCR BRD 8 (SUTURE) ×4 IMPLANT
SUT VIC AB 1 CT1 8-18 (SUTURE) ×4
SUT VIC AB 2-0 CT2 18 VCP726D (SUTURE) ×4 IMPLANT
SYR 20CC LL (SYRINGE) ×4 IMPLANT
SYR BULB IRRIGATION 50ML (SYRINGE) ×4 IMPLANT
SYR CONTROL 10ML LL (SYRINGE) ×8 IMPLANT
SYR TB 1ML LUER SLIP (SYRINGE) ×4 IMPLANT
TAPE CLOTH SURG 4X10 WHT LF (GAUZE/BANDAGES/DRESSINGS) ×4 IMPLANT
TOWEL OR 17X24 6PK STRL BLUE (TOWEL DISPOSABLE) ×4 IMPLANT
TOWEL OR 17X26 10 PK STRL BLUE (TOWEL DISPOSABLE) ×4 IMPLANT
TRAY FOLEY CATH 16FRSI W/METER (SET/KITS/TRAYS/PACK) ×4 IMPLANT
WATER STERILE IRR 1000ML POUR (IV SOLUTION) ×4 IMPLANT
YANKAUER SUCT BULB TIP NO VENT (SUCTIONS) ×4 IMPLANT

## 2015-03-22 NOTE — Anesthesia Preprocedure Evaluation (Addendum)
Anesthesia Evaluation  Patient identified by MRN, date of birth, ID band Patient awake    Reviewed: Allergy & Precautions, NPO status , Patient's Chart, lab work & pertinent test results, reviewed documented beta blocker date and time   Airway Mallampati: II  TM Distance: >3 FB Neck ROM: Full    Dental  (+) Dental Advisory Given   Pulmonary former smoker,  breath sounds clear to auscultation        Cardiovascular hypertension, Pt. on medications and Pt. on home beta blockers Rhythm:Regular Rate:Normal     Neuro/Psych negative neurological ROS     GI/Hepatic Neg liver ROS, hiatal hernia,   Endo/Other  diabetes, Type 2, Insulin DependentMorbid obesity  Renal/GU Renal InsufficiencyRenal disease     Musculoskeletal  (+) Arthritis -,   Abdominal   Peds  Hematology negative hematology ROS (+)   Anesthesia Other Findings   Reproductive/Obstetrics                            Anesthesia Physical Anesthesia Plan  ASA: III  Anesthesia Plan: General   Post-op Pain Management:    Induction: Intravenous  Airway Management Planned: Oral ETT  Additional Equipment:   Intra-op Plan:   Post-operative Plan: Extubation in OR  Informed Consent: I have reviewed the patients History and Physical, chart, labs and discussed the procedure including the risks, benefits and alternatives for the proposed anesthesia with the patient or authorized representative who has indicated his/her understanding and acceptance.   Dental advisory given  Plan Discussed with: CRNA  Anesthesia Plan Comments:         Anesthesia Quick Evaluation

## 2015-03-22 NOTE — Anesthesia Postprocedure Evaluation (Signed)
  Anesthesia Post-op Note  Patient: Alec Gibbs  Procedure(s) Performed: Procedure(s) with comments: Left sided lumbar 5-sacrum 1 transforaminal lumbar interbody fusion with instrumentation, allograft; Lumbar 4-5, lumbar 5-sacrum 1 decompression (Left) - Left sided lumbar 5-sacrum 1 transforaminal lumbar interbody fusion with instrumentation, allograft; Lumbar 4-5, lumbar 5-sacrum 1 decompression BEDSIDE CYSTOSCOPY CYSTOSCOPY WITH URETHRAL BALLOON DILATATION  Patient Location: PACU  Anesthesia Type:General  Level of Consciousness: awake and oriented  Airway and Oxygen Therapy: Patient Spontanous Breathing  Post-op Pain: mild  Post-op Assessment: Post-op Vital signs reviewed and Patient's Cardiovascular Status Stable LLE Motor Response: Purposeful movement, Responds to commands LLE Sensation: Full sensation, No numbness, No tingling RLE Motor Response: Purposeful movement, Responds to commands RLE Sensation: Full sensation, No numbness, No tingling      Post-op Vital Signs: Reviewed and stable  Last Vitals:  Filed Vitals:   03/22/15 1840  BP: 96/59  Pulse: 61  Temp: 36.8 C  Resp: 16    Complications: No apparent anesthesia complications

## 2015-03-22 NOTE — Op Note (Signed)
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 Bjorn Loser  I was consult Dr. Patrecia Pour to assess the patient for inability to pass a catheter under anesthesia before back surgery  The patient was supine under general anesthesia  on a stretcher in the operating room area utilizing sterile technique I tried to pass a 16 [place] coud catheter with resistance near the bladder neck or more distal  Utilizing sterile technique I passed a forceful cystoscoping to the area of obstruction. The penile and most of the bulbar urethra looked normal. At the level of proximal bulbar urethra it looked almost as if the urethra was ablated. It appeared that he likely had a small false passage in the urethra itself could not be seen though there may have been a dimple at 12:00  For approxi-tender 15 minutes I tried to pass a sensor wire and/or open-ended ureteral catheter through what I thought was the dimple but it would not easily curl in the bladder.  At this stage I instructed the staff to get a fluoroscopy table. We placed the patient on a new table in the lithotomy position and did a usual timeout. I used the rigid cystoscope. The same findings were noted under cystoscopy. I passed a sensor wire through the dimple at 12:00 and in my opinion are fluoroscopic guidance it was in the bladder. Under fluoroscopic guidance I passed an open-ended ureteral catheter into what appeared to be the bladder removed the wire. There was urine return  I then did a cystogram to the open-ended ureteral catheter utilizing approxi-20 mL of contrast. His bladder was overdistended and light gray with a mixture of dye and urine. There was no bladder injury.  The sensor wire was passed in the bladder curling nicely in the bladder. The balloon dilation catheter  well-prepared was placed through the stricture into the bladder and bladder neck. I balloon dilated with 18 atm of pressure for 5 minutes. I emptied the balloon and removed it keeping the wire in place.  A 16 [place] catheter was easily placed up in the bladder. A gentle cystogram again confirm no bladder injury and I was in good position. Urine was clear and not bloody.  The patient be followed as per protocol

## 2015-03-22 NOTE — OR Nursing (Signed)
After induction I attempted to insert a 18fr foley cath without success.  I then attempted to insert a 17fr foley cath with again no success.  Dr Merlyn Albert attempted to insert a 68fr foley as well and was unable to fully insert catheter.  The urologist (Dr. Sherron Monday) on call was called into the room.  Dr. Sherron Monday attemped to do a bedside cystoscopy while patient was still on his stretcher and was unable to advance the scope to view bladder.  We then brought in regular OR table so we were able to convert to a cystoscopy.  Then Dr. Sherron Monday was able to pass a wire and then a catheter into the bladder.  Patient was then flipped over into prone position onto the [facility] spinal table for scheduled procedure.

## 2015-03-22 NOTE — Progress Notes (Signed)
Of note, per protocol, foley placement was attempted by circulator x 2 (size 35F, then size 55F). Resistance was met about 1-2 inches from hub without return or urine upon both attempts. I personally also tried to pass foley with a size 35F, again with resistance and without return of urine. I then removed catheter and contacted urologist on call (Dr. Pearson Grippe). He states that he is on the way. Patient is presently intubated supine awaiting his evaluation and management.

## 2015-03-22 NOTE — H&P (Signed)
PREOPERATIVE H&P  Chief Complaint: Bilateral leg pain  HPI: Alec Gibbs isDAVONTAY [place] who presents with ongoing pain in the bilateral legs  MRI reveals a very large mass at L5/S1 and stenosis at L4/5. Spondy noted at L5/S1  Patient has failed multiple forms of conservative care and continues to have pain (see office notes for additional details regarding the patient's full course of treatment)  Past Medical History  Diagnosis Date  . Diabetes mellitus without complication   . Hypertension   . Pancreatitis 2015  . Anemia   . History of hiatal hernia   . Arthritis    Past Surgical History  Procedure Laterality Date  . Replacement total knee Left 2013  . Knee arthroscopy Right 2014  . Elbow surgery  2014    both elbows (separate surgeries)  . Carpal tunnel release Bilateral     1996 and 2015  . Finger surgery  2014    right ring and middle finger  . Eye surgery Right 2010    bleed due to diabetes   Social History   Social History  . Marital Status: Single    Spouse Name: N/A  . Number of Children: N/A  . Years of Education: N/A   Social History Main Topics  . Smoking status: Former Games developer  . Smokeless tobacco: Former Neurosurgeon    Quit date: 03/16/2005  . Alcohol Use: Yes     Comment: 3 beers in 7 days  . Drug Use: No  . Sexual Activity: Not Asked   Other Topics Concern  . None   Social History Narrative   History reviewed. No pertinent family history. Allergies  Allergen Reactions  . Lisinopril Other (See Comments)    cough  . Tramadol Itching   Prior to Admission medications   Medication Sig Start Date End Date Taking? Authorizing Provider  ACCU-CHEK AVIVA PLUS test strip 1 each by Other route 3 (three) times daily.  03/02/15  Yes Historical Provider, MD  allopurinol (ZYLOPRIM) 100 MG tablet Take 100 mg by mouth 3 (three) times daily. 01/17/15  Yes Historical Provider, MD  amLODipine (NORVASC) 10 MG tablet Take 10 mg by mouth daily.   Yes  Historical Provider, MD  atenolol (TENORMIN) 100 MG tablet Take 100 mg by mouth daily.   Yes Historical Provider, MD  B-D UF III MINI PEN NEEDLES 31G X 5 MM MISC Inject 1 each into the skin every evening.  03/01/15  Yes Historical Provider, MD  colchicine 0.6 MG tablet Take 0.6 mg by mouth daily.   Yes Historical Provider, MD  CRESTOR 40 MG tablet Take 40 mg by mouth daily. 12/08/14  Yes Historical Provider, MD  LEVEMIR FLEXTOUCH 100 UNIT/ML Pen Inject 20 Units into the skin every evening.  03/01/15  Yes Historical Provider, MD  losartan (COZAAR) 25 MG tablet Take 25 mg by mouth daily.   Yes Historical Provider, MD  oxyCODONE-acetaminophen (PERCOCET/ROXICET) 5-325 MG per tablet Take 1 tablet by mouth 3 (three) times daily as needed for moderate pain.  02/14/15  Yes Historical Provider, MD  pantoprazole (PROTONIX) 40 MG tablet Take 40 mg by mouth 2 (two) times daily as needed (indigestion).   Yes Historical Provider, MD     All other systems have been reviewed and were otherwise negative with the exception of those mentioned in the HPI and as above.  Physical Exam: Filed Vitals:   03/22/15 0635  BP: 110/64  Pulse: 65  Temp: 97.6 F (  36.4 C)  Resp: 20    General: Alert, no acute distress Cardiovascular: No pedal edema Respiratory: No cyanosis, no use of accessory musculature Skin: No lesions in the area of chief complaint Neurologic: Sensation intact distally Psychiatric: Patient is competent for consent with normal mood and affect Lymphatic: No axillary or cervical lymphadenopathy  MUSCULOSKELETAL: slow gaurded gait  Assessment/Plan: Bilateral leg pain Plan for Procedure(s): Left sided lumbar 5-sacrum 1 transforaminal lumbar interbody fusion with instrumentation, allograft; Lumbar 4-5, lumbar 5-sacrum 1 decompression   Emilee Hero, MD 03/22/2015 8:08 AM

## 2015-03-22 NOTE — Anesthesia Procedure Notes (Signed)
Procedure Name: Intubation Date/Time: 03/22/2015 8:36 AM Performed by: Wray Kearns A Pre-anesthesia Checklist: Patient identified, Emergency Drugs available, Suction available, Patient being monitored and Timeout performed Patient Re-evaluated:Patient Re-evaluated prior to inductionOxygen Delivery Method: Circle system utilized Preoxygenation: Pre-oxygenation with 100% oxygen Intubation Type: IV induction and Cricoid Pressure applied Ventilation: Mask ventilation without difficulty Laryngoscope Size: Mac and 4 Grade View: Grade I Tube type: Oral Tube size: 8.0 mm Number of attempts: 1 Airway Equipment and Method: Stylet Placement Confirmation: ETT inserted through vocal cords under direct vision,  positive ETCO2 and breath sounds checked- equal and bilateral Secured at: 24 cm Tube secured with: Tape Dental Injury: Teeth and Oropharynx as per pre-operative assessment

## 2015-03-22 NOTE — Transfer of Care (Signed)
Immediate Anesthesia Transfer of Care Note  Patient: Alec Gibbs  Procedure(s) Performed: Procedure(s) with comments: Left sided lumbar 5-sacrum 1 transforaminal lumbar interbody fusion with instrumentation, allograft; Lumbar 4-5, lumbar 5-sacrum 1 decompression (Left) - Left sided lumbar 5-sacrum 1 transforaminal lumbar interbody fusion with instrumentation, allograft; Lumbar 4-5, lumbar 5-sacrum 1 decompression BEDSIDE CYSTOSCOPY CYSTOSCOPY WITH URETHRAL BALLOON DILATATION  Patient Location: PACU  Anesthesia Type:General  Level of Consciousness: awake, oriented, sedated, patient cooperative and responds to stimulation  Airway & Oxygen Therapy: Patient Spontanous Breathing and Patient connected to face mask oxygen  Post-op Assessment: Report given to RN, Post -op Vital signs reviewed and stable, Patient moving all extremities and Patient moving all extremities X 4  Post vital signs: Reviewed and stable  Last Vitals:  Filed Vitals:   03/22/15 0635  BP: 110/64  Pulse: 65  Temp: 36.4 C  Resp: 20    Complications: No apparent anesthesia complications

## 2015-03-22 NOTE — Progress Notes (Signed)
Utilization review completed.  

## 2015-03-23 ENCOUNTER — Encounter (HOSPITAL_COMMUNITY): Payer: Self-pay | Admitting: Orthopedic Surgery

## 2015-03-23 LAB — GLUCOSE, CAPILLARY
GLUCOSE-CAPILLARY: 178 mg/dL — AB (ref 65–99)
GLUCOSE-CAPILLARY: 116 mg/dL — AB (ref 65–99)
GLUCOSE-CAPILLARY: 134 mg/dL — AB (ref 65–99)
Glucose-Capillary: 147 mg/dL — ABNORMAL HIGH (ref 65–99)

## 2015-03-23 LAB — POCT I-STAT 4, (NA,K, GLUC, HGB,HCT)
GLUCOSE: 120 mg/dL — AB (ref 65–99)
HEMATOCRIT: 35 % — AB (ref 39.0–52.0)
Hemoglobin: 11.9 g/dL — ABNORMAL LOW (ref 13.0–17.0)
POTASSIUM: 4.1 mmol/L (ref 3.5–5.1)
SODIUM: 135 mmol/L (ref 135–145)

## 2015-03-23 MED FILL — Sodium Chloride Irrigation Soln 0.9%: Qty: 3000 | Status: AC

## 2015-03-23 MED FILL — Sodium Chloride IV Soln 0.9%: INTRAVENOUS | Qty: 1000 | Status: AC

## 2015-03-23 MED FILL — Heparin Sodium (Porcine) Inj 1000 Unit/ML: INTRAMUSCULAR | Qty: 30 | Status: AC

## 2015-03-23 NOTE — Evaluation (Signed)
Occupational Therapy Evaluation Patient Details Name: NARESH ALTHAUS MRN: [id number] DOB: [birth year] Today's Date: 03/23/2015    History of Present Illness Pt s/p L4-S1 fusion. PMH - Lt TKA, DM   Clinical Impression   Pt at min A level with LB ADLs, Mod I with functional mobility. Requires cues for back precautions. Pt provided ADL A/E education    Follow Up Recommendations  No OT follow up    Equipment Recommendations  Tub/shower bench;Other (comment) (ADL A/E)    Recommendations for Other Services       Precautions / Restrictions Precautions Precautions: Back Precaution Booklet Issued: Yes (comment) Precaution Comments: pt able to recall 2/3 back pecautions, reviewed all back precautions with pt Required Braces or Orthoses: Spinal Brace Spinal Brace: Thoracolumbosacral orthotic;Applied in sitting position Restrictions Weight Bearing Restrictions: No      Mobility Bed Mobility Overal bed mobility: Modified Independent                Transfers Overall transfer level: Modified independent Equipment used: Rolling walker (2 wheeled)                  Balance Overall balance assessment: No apparent balance deficits (not formally assessed)                                          ADL                                         General ADL Comments: min A with LB ADLs due to back precautions, educated pt on ADL A/E     Vision  no change from baseline   Perception Perception Perception Tested?: No   Praxis Praxis Praxis tested?: Not tested    Pertinent Vitals/Pain Pain Assessment: 0-10 Pain Score: 2  Faces Pain Scale: Hurts a little bit Pain Location: back Pain Descriptors / Indicators: Aching;Sore Pain Intervention(s): Monitored during session;Repositioned     Hand Dominance Right   Extremity/Trunk Assessment Upper Extremity Assessment Upper Extremity Assessment: Overall WFL for tasks assessed   Lower  Extremity Assessment Lower Extremity Assessment: Defer to PT evaluation   Cervical / Trunk Assessment Cervical / Trunk Assessment: Normal   Communication Communication Communication: No difficulties   Cognition Arousal/Alertness: Awake/alert Behavior During Therapy: WFL for tasks assessed/performed Overall Cognitive Status: Within Functional Limits for tasks assessed                     General Comments   pt very pleasant and cooperative                 Home Living Family/patient expects to be discharged to:: Private residence Living Arrangements: Other relatives Available Help at Discharge: Family;Available PRN/intermittently Type of Home: House Home Access: Stairs to enter [employer] of Steps: 3 Entrance Stairs-Rails: Left Home Layout: One level     Bathroom Shower/Tub: Chief Strategy Officer: Handicapped height     Home Equipment: Environmental consultant - 2 wheels          Prior Functioning/Environment Level of Independence: Independent             OT Diagnosis: Acute pain   OT Problem List: Decreased activity tolerance;Pain   OT Treatment/Interventions:      OT Goals(Current goals can  be found in the care plan section) Acute Rehab OT Goals Patient Stated Goal: go home by tomorrow OT Goal Formulation: With patient  OT Frequency:     Barriers to D/C:  none                        End of Session Equipment Utilized During Treatment: Rolling walker;Back brace  Activity Tolerance: Patient tolerated treatment well Patient left: in bed (sitting EOB)   Time: 1610-9604 OT Time Calculation (min): 16 min Charges:  OT General Charges $OT Visit: 1 Procedure OT Evaluation $Initial OT Evaluation Tier I: 1 Procedure G-Codes:    Galen Manila 03/23/2015, 10:28 AM

## 2015-03-23 NOTE — Plan of Care (Signed)
Problem: Consults Goal: Diagnosis - Spinal Surgery Outcome: Completed/Met Date Met:  03/23/15 Microdiscectomy

## 2015-03-23 NOTE — Evaluation (Addendum)
Physical Therapy Evaluation Patient Details Name: Alec Gibbs MRN: [id number] DOB: September 21, 1960 Today's Date: 03/23/2015   History of Present Illness  Pt s/p L4-S1 fusion. PMH - Lt TKA, DM  Clinical Impression  Pt doing well with mobility and no further PT needed.  Ready for dc from PT standpoint.      Follow Up Recommendations No PT follow up    Equipment Recommendations  None recommended by PT    Recommendations for Other Services       Precautions / Restrictions Precautions Precautions: Back Precaution Booklet Issued: Yes (comment) Required Braces or Orthoses: Spinal Brace Spinal Brace: Thoracolumbosacral orthotic;Applied in sitting position Restrictions Weight Bearing Restrictions: No      Mobility  Bed Mobility Overal bed mobility: Modified Independent                Transfers Overall transfer level: Modified independent Equipment used: Rolling walker (2 wheeled)                Ambulation/Gait Ambulation/Gait assistance: Modified independent (Device/Increase time) Ambulation Distance (Feet): 300 Feet Assistive device: Rolling walker (2 wheeled);None Gait Pattern/deviations: Step-through pattern;Decreased stride length   Gait velocity interpretation: Below normal speed for age/gender General Gait Details: Verbal cues to lock hip hinge at initiation of gait. Pt able to amb with or without walker.  Stairs Stairs: Yes Stairs assistance: Modified independent (Device/Increase time) Stair Management: Step to pattern;Forwards;One rail Left Number of Stairs: 2    Wheelchair Mobility    Modified Rankin (Stroke Patients Only)       Balance Overall balance assessment: No apparent balance deficits (not formally assessed)                                           Pertinent Vitals/Pain Pain Assessment: Faces Faces Pain Scale: Hurts a little bit Pain Location: incisional Pain Descriptors / Indicators: Operative site  guarding Pain Intervention(s): Limited activity within patient's tolerance    Home Living Family/patient expects to be discharged to:: Private residence Living Arrangements: Other relatives (brother) Available Help at Discharge: Family;Available PRN/intermittently Type of Home: House Home Access: Stairs to enter Entrance Stairs-Rails: Left Entrance Stairs-Number of Steps: 3 Home Layout: One level Home Equipment: Walker - 2 wheels      Prior Function Level of Independence: Independent               Hand Dominance        Extremity/Trunk Assessment   Upper Extremity Assessment: Defer to OT evaluation           Lower Extremity Assessment: Overall WFL for tasks assessed         Communication   Communication: No difficulties  Cognition Arousal/Alertness: Awake/alert Behavior During Therapy: WFL for tasks assessed/performed Overall Cognitive Status: Within Functional Limits for tasks assessed                      General Comments      Exercises        Assessment/Plan    PT Assessment Patent does not need any further PT services  PT Diagnosis Difficulty walking   PT Problem List    PT Treatment Interventions     PT Goals (Current goals can be found in the Care Plan section) Acute Rehab PT Goals PT Goal Formulation: All assessment and education complete, DC therapy    Frequency  Barriers to discharge        Co-evaluation               End of Session Equipment Utilized During Treatment: Back brace Activity Tolerance: Patient tolerated treatment well Patient left: in bed (sitting EOB) Nurse Communication: Mobility status         Time: 1610-9604 PT Time Calculation (min) (ACUTE ONLY): 15 min   Charges:   PT Evaluation $Initial PT Evaluation Tier I: 1 Procedure     PT G Codes:        Ventura Leggitt 05-Apr-2015, 10:11 AM  Skip Mayer PT [phone]

## 2015-03-23 NOTE — Op Note (Signed)
NAME:  Alec Gibbs, Alec Gibbs NO.:  [id number]  MEDICAL RECORD NO.:  [id number]  LOCATION:  3C09C                        FACILITY:  [facility]  PHYSICIAN:  Estill Bamberg, MD      DATE OF BIRTH:  [birth year]  DATE OF PROCEDURE:  03/22/2015                              OPERATIVE REPORT   PREOPERATIVE DIAGNOSES: 1. Severe L5-S1 spinal stenosis with a very large space occupying     lesion occupying approximately 80% of the spinal canal at this     level.  This was resulting in bilateral leg pain. 2. L4-5 spinal stenosis. 3. L5-S1 spondylolisthesis.  PPOSTOPERATIVE DIAGNOSES: 1. Severe L5-S1 spinal stenosis with a very large space occupying     lesion occupying approximately 80% of the spinal canal at this     level.  This was resulting in bilateral leg pain. 2. L4-5 spinal stenosis. 3. L5-S1 spondylolisthesis.  PROCEDURE: 1. L5-S1 decompression.  Of note, this was a very complex and a very     time consuming portion of the procedure which did take     approximately 2 hours, this was secondary to a very large space-     occupying lesion at the L5-S1 level occupying approximately 80% of     the spinal canal. 2. L4-5 decompression. 3. Left-sided L5-S1 transforaminal lumbar interbody fusion. 4. Right-sided L5-S1 posterolateral fusion. 5. Placement of posterior instrumentation, L5, S1. 6. Insertion of interbody device x1 (12 x 27 mm Concorde bullet cage). 7. Use of morselized allograft. 8. Use of local autograft. 9. Intraoperative use of fluoroscopy.  SURGEON:  Estill Bamberg, MD  ASSISTANT:  Jason Coop, PA-C  ANESTHESIA:  General endotracheal anesthesia.  ESTIMATED BLOOD LOSS:  250 mL.  COMPLICATIONS:  None.  DISPOSITION:  Stable.  SPECIMENS:  L5-S1 epidural mass, sent to Pathology in formalin.  INDICATIONS FOR SURGERY:  Briefly, [name] is a pleasant 54 year old male, who did present to me with bilateral leg pain.  An MRI did reveal an ill-defined  space-occupying the L5-S1 spinal canal.  There was also noted to be moderate spinal stenosis at L4-5.  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  Given the patient's substantial symptoms, we did discuss proceeding with the procedure noted above.  The patient did elect to proceed after full understanding of the risks and benefits of surgery.  OPERATIVE DETAILS:  On March 22, 2015, the patient was brought to surgery and general endotracheal anesthesia was administered.  Of note, the nurse attempted to pass a Foley catheter twice.  He was unsuccessful.  I did personally make an additional attempt to pass the Foley, however this was unsuccessful.  The urologist on-call was consulted.  He did also attempt to pass a catheter, but was unable do so.  He did perform a procedure in the operating room under anesthesia and under sterile conditions in order to pass a Foley catheter, which he ultimately did.  His instructions made were to keep the Foley catheter in place for approximately 1 week, after which point the patient will follow up with him.  Once the catheter was appropriately placed, the patient was placed prone  on a well-padded flat Jackson bed with a spinal frame.  Antibiotics were given and a time-out procedure was performed. The back was prepped and draped.  I then made a midline incision spanning L4-S1.  The fascia was incised in the midline.  The paraspinal musculature was bluntly swept laterally.  Using anatomic landmarks in addition to fluoroscopy, I did cannulate the L5 and S1 pedicles in the usual fashion.  On the patient's right side, I did decorticate the posterolateral gutter and the posterior elements across the L5-S1 region.  I then placed a 6 x 45 mm screw into the right L5 pedicle and a 7 x 40 mm screw into the right S1 pedicle.  A 35 mm rod was placed. Distraction was applied across the rod.  On the left side,  after cannulating the L5 and S1 pedicles, bone wax was placed in the cannulated pedicle holes.  I then proceeded with the decompressive aspect of the procedure.  The lamina of L5 was removed.  I then performed a standard central and bilateral lateral recess decompression at both L4-5 and L5-S1.  Of note, the epidural space and the dura was readily noted.  It was readily identified that there was a very large space-occupying lesion displacing the majority of the dura at the L5-S1 level to the right side.  Removing the mass was an extremely meticulous and time consuming aspect of the procedure.  This portion of the procedure did take approximately 2 hours.  I was able to safely retract the dura and the traversing S1 nerve medially well in a piecemeal fashion, I did remove a substantial space-occupying lesion.  The appearance of the space-occupying lesion did have a consistency of a grainy chalk like substance.  This was sent to Pathology for additional testing.  Once the L4-5 and L5-S1 levels were decompressed, with an assistant holding medial retraction of the traversing S1 nerve, I did use a 15-blade knife to perform an annulotomy at the left posterolateral aspect of the intervertebral disk.  I then used a series of curettes and pituitary rongeurs to perform a thorough and complete L5-S1 intervertebral diskectomy.  The intervertebral space was then packed with autograft in addition to the DBX mix.  The appropriate size intervertebral spacer was also packed with autograft and allograft and tamped into position.  I was very pleased with the press fit of the implant.  Distraction was then removed on the contralateral right side. I then placed autograft and allograft into the right posterolateral gutter.  I then placed a 6 x 45 mm screw on the left at L5 and a 7 x 40 mm screw on the left at S1.  A 40 mm rod was placed and caps were placed and a final locking procedure was performed on both  the right and on the left sides.  Of note, I did liberally irrigate throughout the surgery and I did use approximately 3 L of normal saline throughout the surgery.  All epidural bleeding was controlled using bipolar electrocautery in addition to Surgiflo.  I did place a #15 deep Blake drain, medially over the dura.  I was very pleased with the final AP and lateral fluoroscopic images.  I then closed the fascia using #1 Vicryl.  The subcutaneous layer was then closed using 2-0 Vicryl followed by 3-0 Monocryl. Benzoin and Steri-Strips were applied followed by sterile dressing.  All instrument counts were correct at the termination of the procedure.  Of note, I did use neurologic monitoring throughout  the surgery, and there was no abnormal EMG activity noted throughout the surgery.  Of note, Jason Coop, was my assistant throughout surgery, and did aid in retraction, suctioning, and closure, from start to finish.     Estill Bamberg, MD     MD/MEDQ  D:  03/22/2015  T:  03/23/2015  Job:  [id number]

## 2015-03-23 NOTE — Progress Notes (Signed)
    Patient doing well Patient denies b/l leg pain, + expected LBP Patient walked yesterday and felt great with an obviously resolution of his bilateral leg pain and numbness Patient states that he feels great   Physical Exam: Filed Vitals:   03/23/15 0400  BP: 109/56  Pulse: 92  Temp: 100.9 F (38.3 C)  Resp: 20    Patient sitting at edge of bed, looks great Dressing in place NVI  Drain output: 140cc/10 hours (14cc/hour)  POD #1 s/p L4-S1 decompression and L5/S1 fusion  - up with PT/OT, encourage ambulation, brace when ambulating - Percocet for pain, Valium for muscle spasms - likely d/c home today vs. tomorrow - foley management per Dr. Sherron Monday - maintain lumbar drain until at least tomorrow or later today

## 2015-03-24 ENCOUNTER — Encounter (HOSPITAL_COMMUNITY): Payer: Self-pay | Admitting: Orthopedic Surgery

## 2015-03-24 LAB — GLUCOSE, CAPILLARY: GLUCOSE-CAPILLARY: 104 mg/dL — AB (ref 65–99)

## 2015-03-24 NOTE — Care Management Important Message (Signed)
Important Message  Patient Details  Name: Alec Gibbs MRN: [id number] Date of Birth: 1960-12-12   Medicare Important Message Given:  Yes-second notification given    Kyla Balzarine 03/24/2015, 12:29 PMImportant Message  Patient Details  Name: Alec Gibbs MRN: [id number] Date of Birth: 1960-12-23   Medicare Important Message Given:  Yes-second notification given    Kyla Balzarine 03/24/2015, 12:28 PM

## 2015-03-24 NOTE — Progress Notes (Signed)
    Patient doing well, resolved leg pain, minimal low back pain doing great has been cleared by PT/OT. Brother available to pick up.    Physical Exam: BP 111/63 mmHg  Pulse 83  Temp(Src) 99.4 F (37.4 C) (Oral)  Resp 20  Ht  (1.803 m)  Wt 122.018 kg (269 lb)  BMI 37.53 kg/m2  SpO2 96%  Dressing in place, drain in place, only 20cc over last 8hrs, pulled without difficulty NVI, pt appears very comfortbale  POD #2 s/p L4-S1 Decompression and L5-S1 fusion resolved leg pain well controlled LBP  - encourage ambulation  -Back precautions at all times   -TLSO when OOB - Percocet for pain, Valium for muscle spasms  -Written scripts in chart signed for D/C - D/C instructions printed in chart  - likely d/c home today - F/U 2 weeks

## 2015-03-24 NOTE — Progress Notes (Signed)
Patient alert and oriented, mae's well, voiding adequate amount of urine, swallowing without difficulty, c/o mild pain. Patient discharged home with family. Script and discharged instructions given to patient, and also foley catheter education given to brother for home care. Patient and family stated understanding of d/c instructions given and has an appointment with MD.

## 2015-03-26 NOTE — Anesthesia Postprocedure Evaluation (Signed)
  Anesthesia Post-op Note  Patient: Alec Gibbs  Procedure(s) Performed: Procedure(s) with comments: Left sided lumbar 5-sacrum 1 transforaminal lumbar interbody fusion with instrumentation, allograft; Lumbar 4-5, lumbar 5-sacrum 1 decompression (Left) - Left sided lumbar 5-sacrum 1 transforaminal lumbar interbody fusion with instrumentation, allograft; Lumbar 4-5, lumbar 5-sacrum 1 decompression BEDSIDE CYSTOSCOPY CYSTOSCOPY WITH URETHRAL BALLOON DILATATION  Patient Location: PACU  Anesthesia Type:General  Level of Consciousness: awake  Airway and Oxygen Therapy: Patient Spontanous Breathing  Post-op Pain: mild  Post-op Assessment: Post-op Vital signs reviewed, Patient's Cardiovascular Status Stable, Respiratory Function Stable, Patent Airway, No signs of Nausea or vomiting and Pain level controlled LLE Motor Response: Purposeful movement, Responds to commands LLE Sensation: Full sensation, No numbness, No tingling RLE Motor Response: Purposeful movement, Responds to commands RLE Sensation: Full sensation, No numbness, No tingling      Post-op Vital Signs: Reviewed and stable  Last Vitals:  Filed Vitals:   03/24/15 0811  BP: 84/58  Pulse: 68  Temp: 37.1 C  Resp: 18    Complications: No apparent anesthesia complications

## 2015-03-27 ENCOUNTER — Encounter (HOSPITAL_COMMUNITY): Payer: Self-pay | Admitting: Orthopedic Surgery

## 2015-04-05 NOTE — Discharge Summary (Signed)
Patient ID: Alec Gibbs MRN: [id number] DOB/AGE: 1960/08/18 54 y.o.  Admit date: 03/22/2015 Discharge date: 03/24/2015  Admission Diagnoses:  Active Problems:   Radicular leg pain   Discharge Diagnoses:  Same  Past Medical History  Diagnosis Date  . Diabetes mellitus without complication   . Hypertension   . Pancreatitis 2015  . Anemia   . History of hiatal hernia   . Arthritis     Surgeries: Procedure(s): Left sided lumbar 5-sacrum 1 transforaminal lumbar interbody fusion with instrumentation, allograft; Lumbar 4-5, lumbar 5-sacrum 1 decompression BEDSIDE CYSTOSCOPY CYSTOSCOPY WITH URETHRAL BALLOON DILATATION on 03/22/2015   Consultants:  Urology  Discharged Condition: Improved  Hospital Course: Alec Gibbs is an 54 y.o. male who was admitted 03/22/2015 for operative treatment of spinal stenosis. Patient has severe unremitting pain that affects sleep, daily activities, and work/hobbies. After pre-op clearance the patient was taken to the operating room on 03/22/2015 and underwent  Procedure(s): Left sided lumbar 5-sacrum 1 transforaminal lumbar interbody fusion with instrumentation, allograft; Lumbar 4-5, lumbar 5-sacrum 1 decompression BEDSIDE CYSTOSCOPY CYSTOSCOPY WITH URETHRAL BALLOON DILATATION.    Patient was given perioperative antibiotics:  Anti-infectives    Start     Dose/Rate Route Frequency Ordered Stop   03/22/15 2200  ceFAZolin (ANCEF) IVPB 1 g/50 mL premix     1 g 100 mL/hr over 30 Minutes Intravenous Every 8 hours 03/22/15 2022 03/23/15 0604   03/22/15 1300  ceFAZolin (ANCEF) 3 g in dextrose 5 % 50 mL IVPB  Status:  Discontinued     3 g 160 mL/hr over 30 Minutes Intravenous To Surgery 03/22/15 1250 03/22/15 1924   03/22/15 1045  ciprofloxacin (CIPRO) IVPB 400 mg     400 mg 200 mL/hr over 60 Minutes Intravenous To Surgery 03/22/15 1035 03/22/15 1145   03/22/15 0600  ceFAZolin (ANCEF) 3 g in dextrose 5 % 50 mL IVPB     3 g 160 mL/hr over 30  Minutes Intravenous On call to O.R. 03/21/15 1423 03/22/15 1315       Patient was given sequential compression devices, early ambulation to prevent DVT.  Patient benefited maximally from hospital stay and there were complications. Foley could not be placed in OR and urology was consulted. Urology performed cystoscopy with dilation and foley was placed with pt to f/u outpt with urology.  Recent vital signs: BP 84/58 mmHg  Pulse 68  Temp(Src) 98.7 F (37.1 C) (Oral)  Resp 18  Ht 5\' 11"  (1.803 m)  Wt 122.018 kg (269 lb)  BMI 37.53 kg/m2  SpO2 98%  Discharge Medications:     Medication List    TAKE these medications        ACCU-CHEK AVIVA PLUS test strip  Generic drug:  glucose blood  1 each by Other route 3 (three) times daily.     allopurinol 100 MG tablet  Commonly known as:  ZYLOPRIM  Take 100 mg by mouth 3 (three) times daily.     amLODipine 10 MG tablet  Commonly known as:  NORVASC  Take 10 mg by mouth daily.     atenolol 100 MG tablet  Commonly known as:  TENORMIN  Take 100 mg by mouth daily.     B-D UF III MINI PEN NEEDLES 31G X 5 MM Misc  Generic drug:  Insulin Pen Needle  Inject 1 each into the skin every evening.     colchicine 0.6 MG tablet  Take 0.6 mg by mouth daily.  CRESTOR 40 MG tablet  Generic drug:  rosuvastatin  Take 40 mg by mouth daily.     LEVEMIR FLEXTOUCH 100 UNIT/ML Pen  Generic drug:  Insulin Detemir  Inject 20 Units into the skin every evening.     losartan 25 MG tablet  Commonly known as:  COZAAR  Take 25 mg by mouth daily.     oxyCODONE-acetaminophen 5-325 MG per tablet  Commonly known as:  PERCOCET/ROXICET  Take 1 tablet by mouth 3 (three) times daily as needed for moderate pain.     pantoprazole 40 MG tablet  Commonly known as:  PROTONIX  Take 40 mg by mouth 2 (two) times daily as needed (indigestion).        Diagnostic Studies: Dg Chest 2 View  03/17/2015   CLINICAL DATA:  Spine surgery.  EXAM: CHEST  2 VIEW   COMPARISON:  05/26/2014  FINDINGS: The heart size and mediastinal contours are within normal limits. Both lungs are clear. The visualized skeletal structures are unremarkable.  IMPRESSION: No active cardiopulmonary disease.   Electronically Signed   By: Maisie Fus  Register   On: 03/17/2015 13:44   Dg Lumbar Spine 2-3 Views  03/22/2015   CLINICAL DATA:  L5-S1 interbody fusion  EXAM: DG C-ARM 61-120 MIN; LUMBAR SPINE - 2-3 VIEW  COMPARISON:  Intraoperative films from earlier in the same day  FLUOROSCOPY TIME:  Radiation Exposure Index (as provided by the fluoroscopic device): Not available  If the device does not provide the exposure index:  Fluoroscopy Time:  32.1 seconds  Number of Acquired Images:  2  FINDINGS: Pedicle screws are now seen at L5-S1 with posterior fusion. Interbody fusion is noted at this level as well.  IMPRESSION: Status post L5-S1 interbody fusion with posterior fixation.   Electronically Signed   By: Alcide Clever M.D.   On: 03/22/2015 16:47   X-ray Lumbar Spine Ap And Lateral  03/17/2015   CLINICAL DATA:  Preop lumbar surgery  EXAM: LUMBAR SPINE - 2-3 VIEW  COMPARISON:  MRI 03/07/2015  FINDINGS: Degenerative disc disease at L5-S1. Degenerative facet disease at L4-5 and L5-S1. Mild anterolisthesis of L5 on S1. Slight rightward scoliosis. No fracture or acute bony abnormality.  IMPRESSION: Degenerative disc and facet disease in the lower lumbar spine. Mi anterolisthesis of L5 on S1. No acute findings.   Electronically Signed   By: Charlett Nose M.D.   On: 03/17/2015 14:09   Mr Lumbar Spine W Wo Contrast  03/07/2015   CLINICAL DATA:  Large soft tissue mass in the spinal canal along the left side.  EXAM: MRI LUMBAR SPINE WITHOUT AND WITH CONTRAST  TECHNIQUE: Multiplanar and multiecho pulse sequences of the lumbar spine were obtained without and with intravenous contrast.  CONTRAST:  20mL MULTIHANCE GADOBENATE DIMEGLUMINE 529 MG/ML IV SOLN  COMPARISON:  CT abdomen 03/08/2014, CT abdomen  05/06/2014, CT lumbar spine 03/01/2015  FINDINGS: The vertebral bodies of the lumbar spine are normal in size. There is grade 1 anterolisthesis of L5 on S1 secondary to bilateral facet arthropathy. There is normal bone marrow signal demonstrated throughout the vertebra. The intervertebral disc spaces are well-maintained.  The spinal cord is normal in signal and contour. The cord terminates normally at L1 . The nerve roots of the cauda equina and the filum terminale are normal.  The visualized portions of the SI joints are unremarkable.  The imaged intra-abdominal contents are unremarkable.  T12-L1: No significant disc bulge. No evidence of neural foraminal stenosis. No central canal stenosis.  L1-L2: No significant disc bulge. No evidence of neural foraminal stenosis. No central canal stenosis.  L2-L3: No significant disc bulge. No evidence of neural foraminal stenosis. No central canal stenosis.  L3-L4: Mild broad-based disc bulge. No evidence of neural foraminal stenosis. No central canal stenosis.  L4-L5: Mild broad-based disc bulge. Mild bilateral facet arthropathy. No evidence of neural foraminal stenosis. No central canal stenosis. There is enhancing soft tissue along the right posterior facet. Correlation with recent CT on 03/01/2015 demonstrates erosive changes of the bilateral L4-5 facets.  L5-S1: There is a 2.1 x 1.7 x 2.4 cm left paracentral extradural mass with foraminal extension. Severe resultant left foraminal stenosis. Severe spinal canal stenosis and left lateral recess stenosis. Broad-based disc bulge. Moderate -severe right foraminal stenosis. Severe bilateral facet arthropathy. No evidence of neural foraminal stenosis. No central canal stenosis. There is enhancing soft tissue involving the right L5-S1 facet.  IMPRESSION: 1. Progressively enlarging 2.1 x 1.7 x 2.4 cm left paracentral extradural mass with foraminal extension resulting in severe left foraminal stenosis and severe spinal stenosis.  There is similar soft tissue signal along the posterior facet joints at L4-5 and L5-S1 bilaterally with erosive changes of the L4-5 facets. Correlation is made with the prior CT of the lumbar spine dated 03/01/2015 and prior CT abdomen/pelvis dated 03/08/2014 and 05/06/2014. 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 2. At L5-S1, there is a broad-based disc bulge with moderate -severe right foraminal stenosis. 3. At L4-5 there is a mild broad-based disc bulge and mild bilateral facet arthropathy.   Electronically Signed   By: Elige Ko   On: 03/07/2015 17:02   Dg Cystogram  03/22/2015   CLINICAL DATA:  Difficult catheter placement  EXAM: CYSTOGRAM  FLUOROSCOPY TIME:  1 min 20 seconds; 2 submitted images  COMPARISON:  CT abdomen and pelvis March 08, 2014  FINDINGS: On the first submitted frontal image, a catheter is present in the urinary bladder. The contour of the bladder appears normal. On subsequent frontal image, a catheter is present with the tip in the inferior bladder. The bladder contour is normal. No mass is seen on this study within the bladder.  Along the superior aspect of the bladder, there is vague opacity which probably represents overlapping normal tissue. Given only single frontal projections, however, conceivably this area could represent a urinary bladder leak.  IMPRESSION: Frontal views only submitted. Urinary bladder contour appears normal in contour without focal lesion. It must be noted, however, that along the superior aspect of the bladder, there is an ill-defined area of opacity that conceivably could represent a leak. This structure does not affect the contour of the superior urinary bladder which strongly suggests that this opacity represents overlying tissue as opposed to arising from the bladder. Most likely this area represents overlapping normal structure. If there is any concern for potential urinary bladder leak, CT could be helpful to  further assess.   Electronically Signed   By: Bretta Bang III M.D.   On: 03/22/2015 18:27   Dg Lumbar Spine 1 View  03/22/2015   CLINICAL DATA:  Lumbar fusion.  EXAM: LUMBAR SPINE - 1 VIEW  COMPARISON:  MRI of March 07, 2015.  FINDINGS: Single cross-table lateral intraoperative projection of the lumbar spine was obtained. 2 surgical probes are noted posteriorly, 1 directed approximately at the L4 level and the other at approximately S1.  IMPRESSION: Surgical localization as described above.   Electronically Signed   By: Fayrene Fearing  Christen Butter, M.D.   On: 03/22/2015 14:08   Dg C-arm 61-120 Min  03/22/2015   CLINICAL DATA:  L5-S1 interbody fusion  EXAM: DG C-ARM 61-120 MIN; LUMBAR SPINE - 2-3 VIEW  COMPARISON:  Intraoperative films from earlier in the same day  FLUOROSCOPY TIME:  Radiation Exposure Index (as provided by the fluoroscopic device): Not available  If the device does not provide the exposure index:  Fluoroscopy Time:  32.1 seconds  Number of Acquired Images:  2  FINDINGS: Pedicle screws are now seen at L5-S1 with posterior fusion. Interbody fusion is noted at this level as well.  IMPRESSION: Status post L5-S1 interbody fusion with posterior fixation.   Electronically Signed   By: Alcide Clever M.D.   On: 03/22/2015 16:47    Disposition: 01-Home or Self Care   POD #2 s/p L4-S1 Decompression and L5-S1 fusion resolved leg pain well controlled LBP  - encourage ambulation -Back precautions at all times  -TLSO when OOB - Percocet for pain, Valium for muscle spasms -Written scripts in chart signed for D/C - D/C instructions printed in chart  -D/C today  -F/U in office 2 weeks   Signed: Georga Bora 04/05/2015, 12:57 PM

## 2017-03-08 IMAGING — CT CT L SPINE W/O CM
3 of 11 series · 11 of 33 positions shown, 14 images · non-contrast
Comparison: Lumbar radiographs 11/29/2014

CLINICAL DATA: Low back pain without sciatica.

EXAM:
CT LUMBAR SPINE WITHOUT CONTRAST
TECHNIQUE: Multidetector CT imaging of the lumbar spine was performed without
intravenous contrast administration. Multiplanar CT image
reconstructions were also generated.

[Series 3: l spine soft · axial · 0.27mm/px · z∈[-446,-284]mm · 5 of 82 slices shown, 7 images]
[im 14/82  soft-tissue]
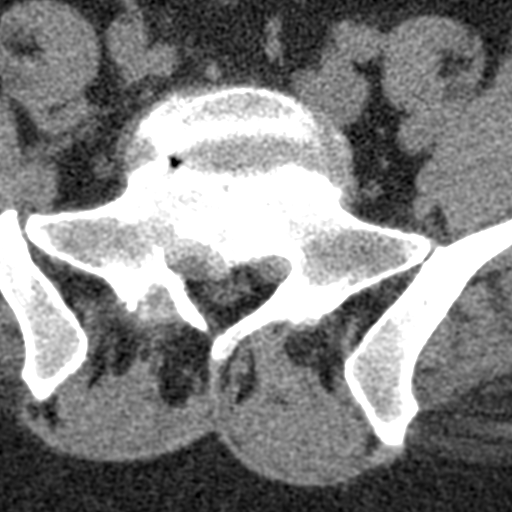
[im 14/82  bone]
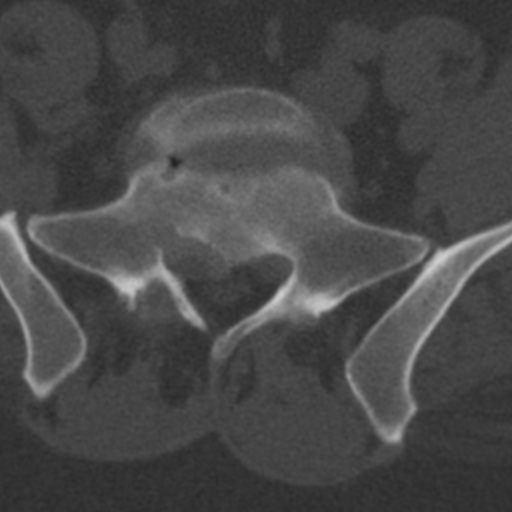
[im 28/82  bone]
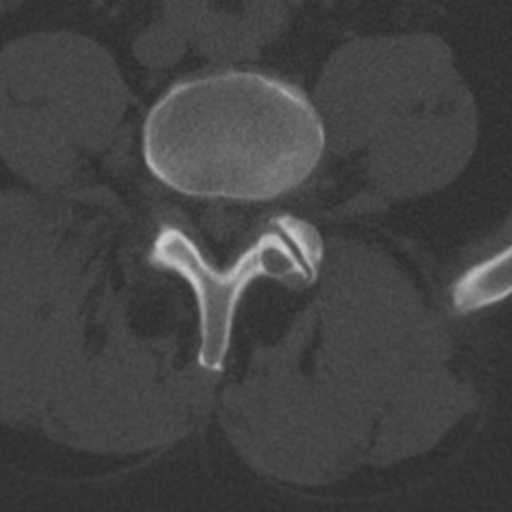
[im 41/82  bone]
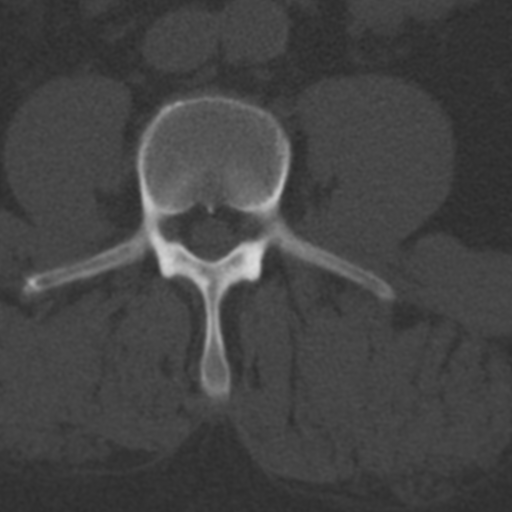
[im 55/82  bone]
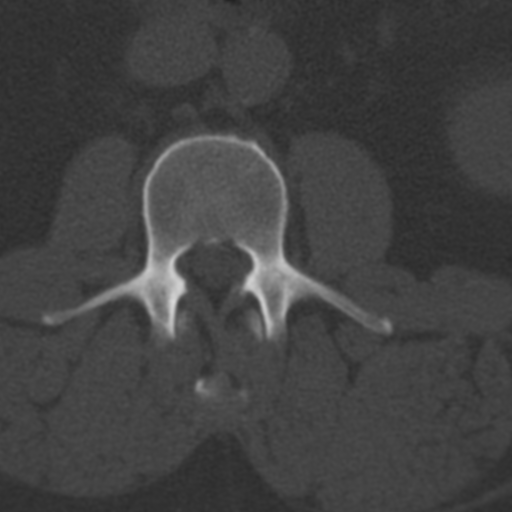
[im 68/82  soft-tissue]
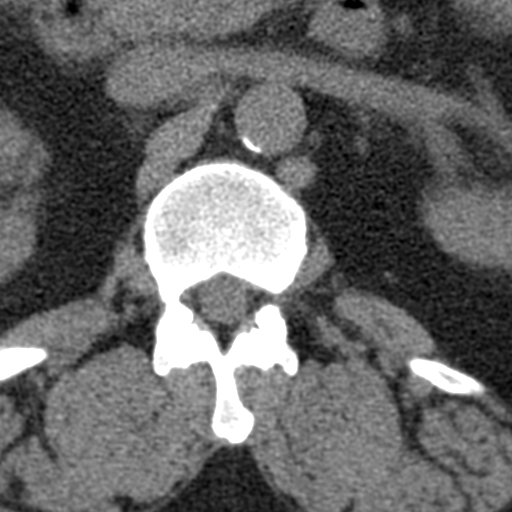
[im 68/82  bone]
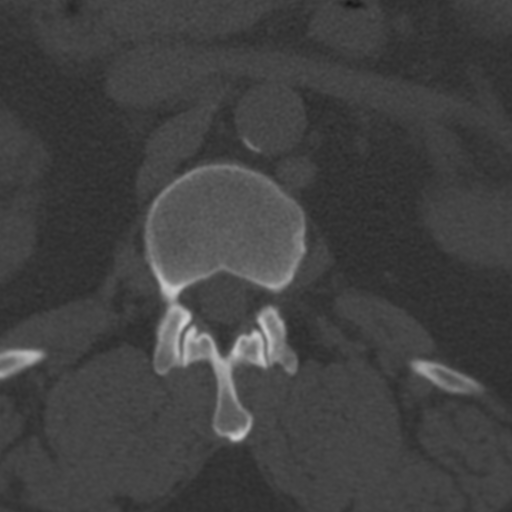

[Series 7: bone cor upper · coronal · 0.28mm/px · 1 of 36 slices shown]
[im 18/36  bone]
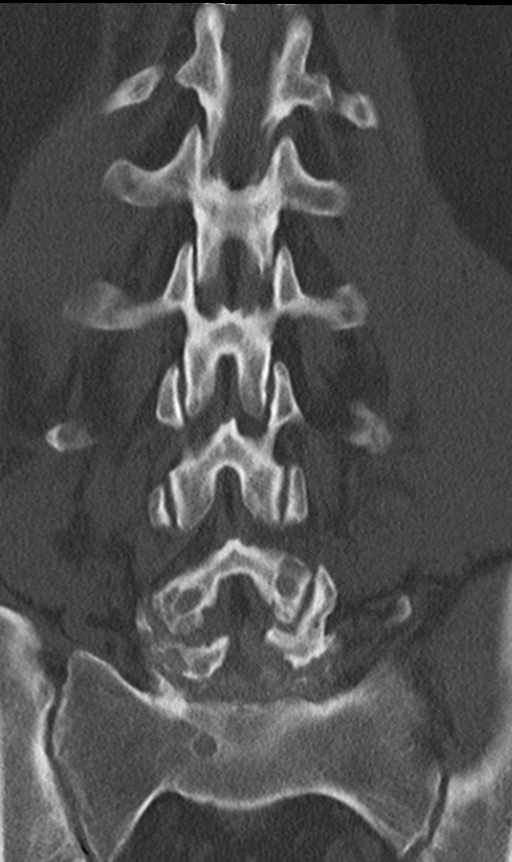

[Series 610: sag st · sagittal · 0.48mm/px · 5 of 46 slices shown, 6 images]
[im 16/46  bone]
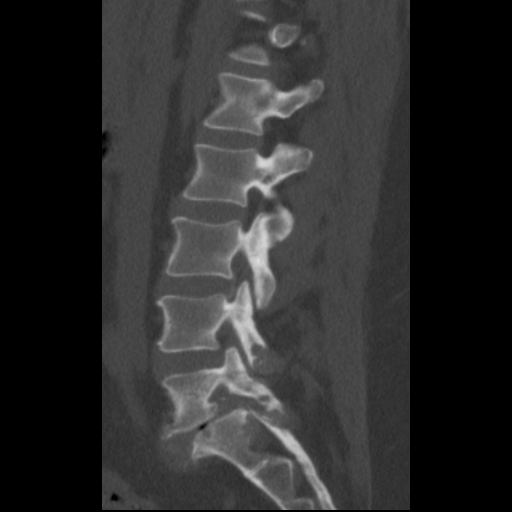
[im 19/46  bone]
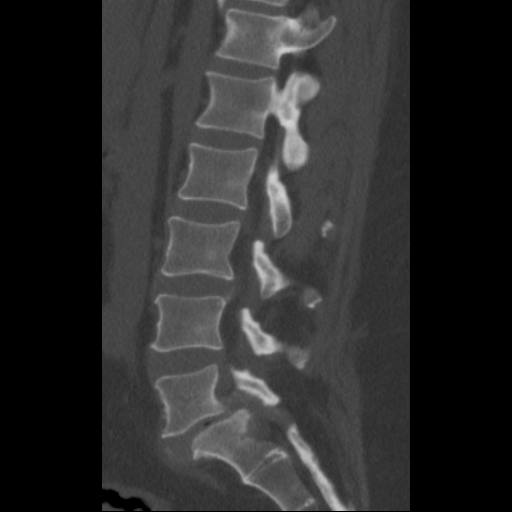
[im 23/46  soft-tissue]
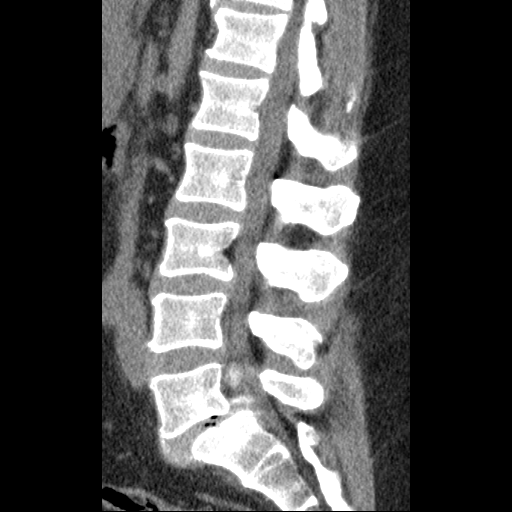
[im 23/46  bone]
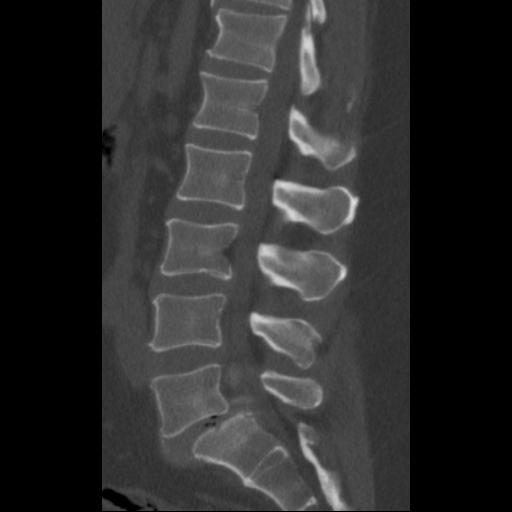
[im 27/46  bone]
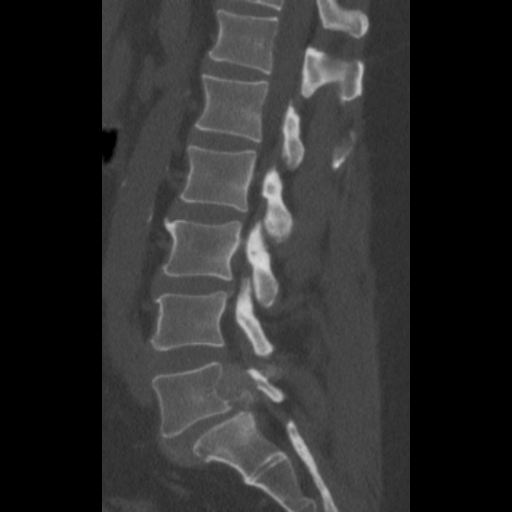
[im 31/46  bone]
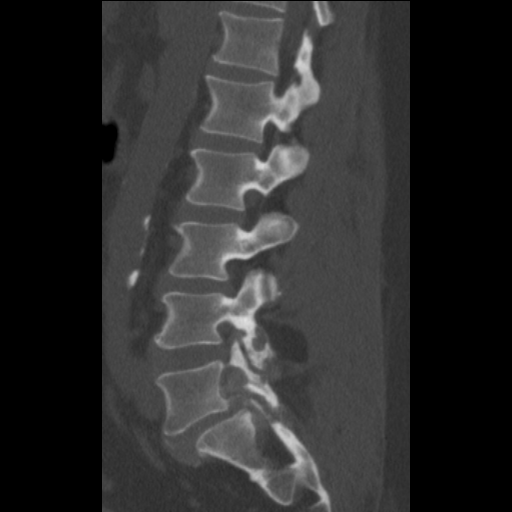

[11 of 33 positions shown; findings below may reference images not displayed]

FINDINGS: Negative for vertebral fracture. Grade 1 slip L5-S1. Remaining
alignment is anatomic.

T12-L1:  Mild disc and facet degeneration without spinal stenosis

L1-2: Mild disc bulging and mild facet degeneration without
significant spinal or foraminal stenosis.

L2-3: Diffuse disc bulging. Bilateral facet hypertrophy with mild
spinal stenosis.

L3-4: Diffuse disc bulging. Bilateral facet hypertrophy causing
moderate spinal stenosis. Neural foramina are patent.

L4-5: Mild disc bulging. Moderate facet hypertrophy with facet joint
overgrowth and erosions. Hyperdense soft tissue is present posterior
to the facet joints bilaterally. Moderate spinal stenosis.

L5-S1: 9 mm anterior slip. Disc degeneration with disc space
narrowing and endplate mild sclerotic changes and spurring. Large
soft tissue mass within the canal on the left side which is
hyperdense and contains small calcifications. The mass is epidural
in location and extends from the L4-5 disc space inferiorly to the
L5-S1 disc space. This has progressed since the CT of 03/08/2014 but
was present at that time. There is advanced facet degeneration with
marked facet joint hypertrophy, with subchondral cystic change. The
soft tissue mass extends into the foramen and lateral to the
foramen. It is difficult determine if this is originating from the
disc or the facet joint. This is causing moderate to severe spinal
stenosis with compression of the thecal sac which is displaced to
the right. Severe foraminal encroachment with impingement of the L5
nerve root bilaterally.
IMPRESSION: Mild spinal stenosis L2-3.  Moderate stenosis at L3-4.

Moderate spinal stenosis L4-5 due to disc bulging and facet erosions

9 mm anterior slip L5-S1. Large hyperdense soft tissue mass in the
spinal canal and foramen on the left with displacement of the thecal
sac and causing moderate to severe spinal stenosis. This has
progressed since 03/08/2014 but was present previously. There is
advanced facet arthrosis with erosion at this level. Differential
diagnosis includes a large hyperdense disc fragment and a very large
synovial cyst. Gout is considered a good possibility given the
hyperdense mass (possible tophus) and the facet erosions at L4-5 and
L5-S1. Other less likely possibilities include epidural hematoma and
meningioma.

I discussed the findings by telephone with the nurse in the
operating room, Lapaz, who was working with Dr. Reinke

## 2019-03-02 ENCOUNTER — Encounter (HOSPITAL_BASED_OUTPATIENT_CLINIC_OR_DEPARTMENT_OTHER): Payer: Self-pay

## 2019-03-02 ENCOUNTER — Emergency Department (HOSPITAL_BASED_OUTPATIENT_CLINIC_OR_DEPARTMENT_OTHER): Payer: Medicare Other

## 2019-03-02 ENCOUNTER — Other Ambulatory Visit: Payer: Self-pay

## 2019-03-02 ENCOUNTER — Inpatient Hospital Stay (HOSPITAL_BASED_OUTPATIENT_CLINIC_OR_DEPARTMENT_OTHER)
Admission: EM | Admit: 2019-03-02 | Discharge: 2019-03-04 | DRG: 432 | Disposition: A | Discharge status: Home / Self Care | Payer: Medicare Other | Attending: Internal Medicine | Admitting: Internal Medicine

## 2019-03-02 DIAGNOSIS — E7220 Disorder of urea cycle metabolism, unspecified: Secondary | ICD-10-CM | POA: Diagnosis not present

## 2019-03-02 DIAGNOSIS — Y92009 Unspecified place in unspecified non-institutional (private) residence as the place of occurrence of the external cause: Secondary | ICD-10-CM | POA: Diagnosis not present

## 2019-03-02 DIAGNOSIS — Z87891 Personal history of nicotine dependence: Secondary | ICD-10-CM | POA: Diagnosis not present

## 2019-03-02 DIAGNOSIS — N179 Acute kidney failure, unspecified: Secondary | ICD-10-CM | POA: Diagnosis present

## 2019-03-02 DIAGNOSIS — B957 Other staphylococcus as the cause of diseases classified elsewhere: Secondary | ICD-10-CM | POA: Diagnosis present

## 2019-03-02 DIAGNOSIS — D638 Anemia in other chronic diseases classified elsewhere: Secondary | ICD-10-CM

## 2019-03-02 DIAGNOSIS — N183 Chronic kidney disease, stage 3 (moderate): Secondary | ICD-10-CM | POA: Diagnosis present

## 2019-03-02 DIAGNOSIS — R05 Cough: Secondary | ICD-10-CM | POA: Diagnosis present

## 2019-03-02 DIAGNOSIS — Z96652 Presence of left artificial knee joint: Secondary | ICD-10-CM | POA: Diagnosis present

## 2019-03-02 DIAGNOSIS — R7401 Elevation of levels of liver transaminase levels: Secondary | ICD-10-CM

## 2019-03-02 DIAGNOSIS — I129 Hypertensive chronic kidney disease with stage 1 through stage 4 chronic kidney disease, or unspecified chronic kidney disease: Secondary | ICD-10-CM | POA: Diagnosis present

## 2019-03-02 DIAGNOSIS — Z888 Allergy status to other drugs, medicaments and biological substances status: Secondary | ICD-10-CM | POA: Diagnosis not present

## 2019-03-02 DIAGNOSIS — F1010 Alcohol abuse, uncomplicated: Secondary | ICD-10-CM | POA: Diagnosis present

## 2019-03-02 DIAGNOSIS — E119 Type 2 diabetes mellitus without complications: Secondary | ICD-10-CM

## 2019-03-02 DIAGNOSIS — K831 Obstruction of bile duct: Secondary | ICD-10-CM | POA: Diagnosis present

## 2019-03-02 DIAGNOSIS — I10 Essential (primary) hypertension: Secondary | ICD-10-CM

## 2019-03-02 DIAGNOSIS — M109 Gout, unspecified: Secondary | ICD-10-CM | POA: Diagnosis present

## 2019-03-02 DIAGNOSIS — Z79899 Other long term (current) drug therapy: Secondary | ICD-10-CM

## 2019-03-02 DIAGNOSIS — E785 Hyperlipidemia, unspecified: Secondary | ICD-10-CM

## 2019-03-02 DIAGNOSIS — Z885 Allergy status to narcotic agent status: Secondary | ICD-10-CM | POA: Diagnosis not present

## 2019-03-02 DIAGNOSIS — Z20828 Contact with and (suspected) exposure to other viral communicable diseases: Secondary | ICD-10-CM | POA: Diagnosis present

## 2019-03-02 DIAGNOSIS — E1122 Type 2 diabetes mellitus with diabetic chronic kidney disease: Secondary | ICD-10-CM | POA: Diagnosis present

## 2019-03-02 DIAGNOSIS — M1990 Unspecified osteoarthritis, unspecified site: Secondary | ICD-10-CM | POA: Diagnosis present

## 2019-03-02 DIAGNOSIS — D539 Nutritional anemia, unspecified: Secondary | ICD-10-CM | POA: Diagnosis not present

## 2019-03-02 DIAGNOSIS — K828 Other specified diseases of gallbladder: Secondary | ICD-10-CM

## 2019-03-02 DIAGNOSIS — D631 Anemia in chronic kidney disease: Secondary | ICD-10-CM | POA: Diagnosis present

## 2019-03-02 DIAGNOSIS — T361X5A Adverse effect of cephalosporins and other beta-lactam antibiotics, initial encounter: Secondary | ICD-10-CM | POA: Diagnosis present

## 2019-03-02 DIAGNOSIS — K7030 Alcoholic cirrhosis of liver without ascites: Secondary | ICD-10-CM | POA: Diagnosis not present

## 2019-03-02 DIAGNOSIS — M549 Dorsalgia, unspecified: Secondary | ICD-10-CM | POA: Diagnosis present

## 2019-03-02 DIAGNOSIS — G8929 Other chronic pain: Secondary | ICD-10-CM | POA: Diagnosis present

## 2019-03-02 DIAGNOSIS — R5383 Other fatigue: Secondary | ICD-10-CM

## 2019-03-02 DIAGNOSIS — R740 Nonspecific elevation of levels of transaminase and lactic acid dehydrogenase [LDH]: Secondary | ICD-10-CM | POA: Diagnosis not present

## 2019-03-02 DIAGNOSIS — N390 Urinary tract infection, site not specified: Secondary | ICD-10-CM | POA: Diagnosis present

## 2019-03-02 DIAGNOSIS — R17 Unspecified jaundice: Secondary | ICD-10-CM

## 2019-03-02 LAB — URINALYSIS, MICROSCOPIC (REFLEX)

## 2019-03-02 LAB — COMPREHENSIVE METABOLIC PANEL
ALT: 28 U/L (ref 0–44)
AST: 106 U/L — ABNORMAL HIGH (ref 15–41)
Albumin: 2.3 g/dL — ABNORMAL LOW (ref 3.5–5.0)
Alkaline Phosphatase: 164 U/L — ABNORMAL HIGH (ref 38–126)
Anion gap: 12 (ref 5–15)
BUN: 26 mg/dL — ABNORMAL HIGH (ref 6–20)
CO2: 16 mmol/L — ABNORMAL LOW (ref 22–32)
Calcium: 8.9 mg/dL (ref 8.9–10.3)
Chloride: 109 mmol/L (ref 98–111)
Creatinine, Ser: 1.9 mg/dL — ABNORMAL HIGH (ref 0.61–1.24)
GFR calc Af Amer: 45 mL/min — ABNORMAL LOW (ref >60)
GFR calc non Af Amer: 39 mL/min — ABNORMAL LOW (ref >60)
Glucose, Bld: 126 mg/dL — ABNORMAL HIGH (ref 70–99)
Potassium: 4.6 mmol/L (ref 3.5–5.1)
Sodium: 137 mmol/L (ref 135–145)
Total Bilirubin: 24.3 mg/dL (ref 0.3–1.2)
Total Protein: 7.3 g/dL (ref 6.5–8.1)

## 2019-03-02 LAB — LIPASE, BLOOD: Lipase: 73 U/L — ABNORMAL HIGH (ref 11–51)

## 2019-03-02 LAB — CBC WITH DIFFERENTIAL/PLATELET
Abs Immature Granulocytes: 0.16 10*3/uL — ABNORMAL HIGH (ref 0.00–0.07)
Basophils Absolute: 0.1 10*3/uL (ref 0.0–0.1)
Basophils Relative: 1 %
Eosinophils Absolute: 0.2 10*3/uL (ref 0.0–0.5)
Eosinophils Relative: 2 %
HCT: 29.1 % — ABNORMAL LOW (ref 39.0–52.0)
Hemoglobin: 9.8 g/dL — ABNORMAL LOW (ref 13.0–17.0)
Immature Granulocytes: 2 %
Lymphocytes Relative: 15 %
Lymphs Abs: 1 10*3/uL (ref 0.7–4.0)
MCH: 35.5 pg — ABNORMAL HIGH (ref 26.0–34.0)
MCHC: 33.7 g/dL (ref 30.0–36.0)
MCV: 105.4 fL — ABNORMAL HIGH (ref 80.0–100.0)
Monocytes Absolute: 1.3 10*3/uL — ABNORMAL HIGH (ref 0.1–1.0)
Monocytes Relative: 19 %
Neutro Abs: 4 10*3/uL (ref 1.7–7.7)
Neutrophils Relative %: 61 %
Platelets: 213 10*3/uL (ref 150–400)
RBC: 2.76 MIL/uL — ABNORMAL LOW (ref 4.22–5.81)
RDW: 19.9 % — ABNORMAL HIGH (ref 11.5–15.5)
WBC: 6.6 10*3/uL (ref 4.0–10.5)
nRBC: 0 % (ref 0.0–0.2)

## 2019-03-02 LAB — URINALYSIS, ROUTINE W REFLEX MICROSCOPIC
Glucose, UA: 100 mg/dL — AB
Hgb urine dipstick: NEGATIVE
Ketones, ur: NEGATIVE mg/dL
Nitrite: NEGATIVE
Protein, ur: NEGATIVE mg/dL
Specific Gravity, Urine: 1.02 (ref 1.005–1.030)
pH: 6 (ref 5.0–8.0)

## 2019-03-02 LAB — TROPONIN I (HIGH SENSITIVITY)
Troponin I (High Sensitivity): 9 ng/L (ref <18)
Troponin I (High Sensitivity): 8 ng/L (ref <18)

## 2019-03-02 LAB — AMMONIA: Ammonia: 58 umol/L — ABNORMAL HIGH (ref 9–35)

## 2019-03-02 LAB — SARS CORONAVIRUS 2 BY RT PCR (HOSPITAL ORDER, PERFORMED IN ~~LOC~~ HOSPITAL LAB): SARS Coronavirus 2: NEGATIVE

## 2019-03-02 LAB — PROTIME-INR
INR: 1.8 — ABNORMAL HIGH (ref 0.8–1.2)
Prothrombin Time: 20.3 seconds — ABNORMAL HIGH (ref 11.4–15.2)

## 2019-03-02 LAB — TSH: TSH: 1.706 u[IU]/mL (ref 0.350–4.500)

## 2019-03-02 MED ORDER — MORPHINE SULFATE (PF) 4 MG/ML IV SOLN
4.0000 mg | Freq: Once | INTRAVENOUS | Status: AC
  Administered 2019-03-02: 4 mg via INTRAVENOUS
  Filled 2019-03-02: qty 1

## 2019-03-02 MED ORDER — HEPARIN SODIUM (PORCINE) 5000 UNIT/ML IJ SOLN
5000.0000 [IU] | Freq: Three times a day (TID) | INTRAMUSCULAR | Status: DC
  Administered 2019-03-03 – 2019-03-04 (×3): 5000 [IU] via SUBCUTANEOUS
  Filled 2019-03-02 (×4): qty 1

## 2019-03-02 MED ORDER — SODIUM CHLORIDE 0.9 % IV BOLUS
1000.0000 mL | Freq: Once | INTRAVENOUS | Status: AC
  Administered 2019-03-02: 1000 mL via INTRAVENOUS

## 2019-03-02 MED ORDER — ONDANSETRON HCL 4 MG/2ML IJ SOLN
4.0000 mg | Freq: Once | INTRAMUSCULAR | Status: AC
  Administered 2019-03-02: 4 mg via INTRAVENOUS
  Filled 2019-03-02: qty 2

## 2019-03-02 NOTE — ED Notes (Signed)
Report called to Mariah, RN.

## 2019-03-02 NOTE — ED Notes (Signed)
MD made aware of difficulty obtaining PIV, ok to hold IVF for now, given PO fluids, tolerating without difficulty

## 2019-03-02 NOTE — Treatment Plan (Signed)
Was called for admission by Dr. Marda Gibbs at [facility] on this patient [name] date of birth [birth year].  The patient is a 58 year old African-American male with a past medical history significant for but not limited to anemia, arthritis, diabetes mellitus, hypertension, hyperlipidemia, pancreatitis and other comorbidities who presents with a chief complaint of vague symptoms including fatigue, shortness breath and cough as well as chills but no chest pain.  Patient admits to having a decreased appetite and denies any abdominal pain.  Further work-up in the ED revealed that his labs were significantly abnormal and had a elevated bilirubin of 24.3 and abnormal AST and ALT as well as elevated ammonia level.  Patient INR is 1.8.  Coag tests are still currently pending.  Chest x-ray was negative.  Dr. Cristina Gibbs of gastroenterology was consulted and recommends hospitalist admission and work-up with a right upper quadrant ultrasound and further imaging.  He was given 1 L of normal saline in the ED.  Patient also has an AKI.  He was accepted to a inpatient MedSurg bed as a person under investigation given his vague symptoms and COVID test pending.

## 2019-03-02 NOTE — ED Provider Notes (Signed)
[facility] Provider Note   CSN: [id number] Arrival date & time: 03/02/19  1032     History   Chief Complaint Chief Complaint  Patient presents with  . Cough  . Weakness    HPI [facility] is a 58 y.o. male.     The history is provided by the patient and medical records. No language interpreter was used.  Illness Location:  Generaloized fatigue and SOB Severity:  Moderate Onset quality:  Gradual Duration:  3 weeks Timing:  Constant Progression:  Worsening Chronicity:  New Associated symptoms: cough, diarrhea, fatigue, nausea, shortness of breath and vomiting   Associated symptoms: no abdominal pain, no chest pain, no congestion, no fever, no headaches, no rhinorrhea and no wheezing     Past Medical History:  Diagnosis Date  . Anemia   . Arthritis   . Diabetes mellitus without complication ([place])   . History of hiatal hernia   . Hypertension   . Pancreatitis 2015    Patient Active Problem List   Diagnosis Date Noted  . Radicular leg pain 03/22/2015    Past Surgical History:  Procedure Laterality Date  . CARPAL TUNNEL RELEASE Bilateral    1996 and 2015  . CYSTOSCOPY  03/22/2015   Procedure: BEDSIDE CYSTOSCOPY;  Surgeon: Bjorn Loser, MD;  Location: [facility];  Service: Urology;;  . Consuela Mimes WITH URETHRAL DILATATION  03/22/2015   Procedure: CYSTOSCOPY WITH URETHRAL BALLOON DILATATION;  Surgeon: Bjorn Loser, MD;  Location: [facility];  Service: Urology;;  . ELBOW SURGERY  2014   both elbows (separate surgeries)  . EYE SURGERY Right 2010   bleed due to diabetes  . FINGER SURGERY  2014   right ring and middle finger  . KNEE ARTHROSCOPY Right 2014  . REPLACEMENT TOTAL KNEE Left 2013        Home Medications    Prior to Admission medications   Medication Sig Start Date End Date Taking? Authorizing Provider  ACCU-CHEK AVIVA PLUS test strip 1 each by Other route 3 (three) times daily.  03/02/15   [provider]   allopurinol (ZYLOPRIM) 100 MG tablet Take 100 mg by mouth 3 (three) times daily. 01/17/15   [provider]  amLODipine (NORVASC) 10 MG tablet Take 10 mg by mouth daily.    [provider]  atenolol (TENORMIN) 100 MG tablet Take 100 mg by mouth daily.    [provider]  B-D UF III MINI PEN NEEDLES 31G X 5 MM MISC Inject 1 each into the skin every evening.  03/01/15   [provider]  colchicine 0.6 MG tablet Take 0.6 mg by mouth daily.    [provider]  CRESTOR 40 MG tablet Take 40 mg by mouth daily. 12/08/14   [provider]  LEVEMIR FLEXTOUCH 100 UNIT/ML Pen Inject 20 Units into the skin every evening.  03/01/15   [provider]  losartan (COZAAR) 25 MG tablet Take 25 mg by mouth daily.    [provider]  oxyCODONE-acetaminophen (PERCOCET/ROXICET) 5-325 MG per tablet Take 1 tablet by mouth 3 (three) times daily as needed for moderate pain.  02/14/15   [provider]  pantoprazole (PROTONIX) 40 MG tablet Take 40 mg by mouth 2 (two) times daily as needed (indigestion).    [provider]    Family History History reviewed. No pertinent family history.  Social History Social History   Tobacco Use  . Smoking status: Former Research scientist (life sciences)  . Smokeless tobacco:  Former Systems developer    Quit date: 03/16/2005  Substance Use Topics  . Alcohol use: Yes    Comment: 3 beers in 7 days  . Drug use: No     Allergies   Lisinopril and Tramadol   Review of Systems Review of Systems  Constitutional: Positive for chills and fatigue. Negative for diaphoresis and fever.  HENT: Negative for congestion and rhinorrhea.   Respiratory: Positive for cough and shortness of breath. Negative for choking, chest tightness and wheezing.   Cardiovascular: Negative for chest pain.  Gastrointestinal: Positive for diarrhea, nausea and vomiting. Negative for abdominal pain.  Genitourinary: Positive for dysuria. Negative for flank pain and  frequency.  Musculoskeletal: Negative for back pain, neck pain and neck stiffness.  Neurological: Negative for dizziness, light-headedness and headaches.  Psychiatric/Behavioral: Negative for agitation.  All other systems reviewed and are negative.    Physical Exam Updated Vital Signs BP (!) 108/43 (BP Location: Right Arm)   Pulse 89   Temp 98.5 F (36.9 C) (Oral)   Resp 16   Ht _0  (1.803 m)   Wt 118.8 kg   SpO2 98%   BMI 36.54 kg/m   Physical Exam Vitals signs and nursing note reviewed.  Constitutional:      General: He is not in acute distress.    Appearance: He is well-developed. He is not ill-appearing, toxic-appearing or diaphoretic.  HENT:     Head: Normocephalic and atraumatic.     Nose: No congestion or rhinorrhea.     Mouth/Throat:     Pharynx: No oropharyngeal exudate or posterior oropharyngeal erythema.  Eyes:     General: Scleral icterus present.     Extraocular Movements: Extraocular movements intact.     Conjunctiva/sclera: Conjunctivae normal.     Pupils: Pupils are equal, round, and reactive to light.  Neck:     Musculoskeletal: Neck supple. No muscular tenderness.  Cardiovascular:     Rate and Rhythm: Normal rate and regular rhythm.     Heart sounds: No murmur.  Pulmonary:     Effort: Pulmonary effort is normal. No respiratory distress.     Breath sounds: Normal breath sounds. No wheezing, rhonchi or rales.  Chest:     Chest wall: No tenderness.  Abdominal:     Palpations: Abdomen is soft.     Tenderness: There is no abdominal tenderness. There is no right CVA tenderness or left CVA tenderness.  Musculoskeletal:        General: No tenderness.  Skin:    General: Skin is warm and dry.     Capillary Refill: Capillary refill takes less than 2 seconds.     Coloration: Skin is jaundiced.  Neurological:     General: No focal deficit present.     Mental Status: He is alert.     Sensory: No sensory deficit.     Motor: No weakness.  Psychiatric:         Mood and Affect: Mood normal.      ED Treatments / Results  Labs (all labs ordered are listed, but only abnormal results are displayed) Labs Reviewed  CBC WITH DIFFERENTIAL/PLATELET - Abnormal; Notable for the following components:      Result Value   RBC 2.76 (*)    Hemoglobin 9.8 (*)    HCT 29.1 (*)    MCV 105.4 (*)    MCH 35.5 (*)    RDW 19.9 (*)    Monocytes Absolute 1.3 (*)    Abs Immature Granulocytes 0.16 (*)  All other components within normal limits  COMPREHENSIVE METABOLIC PANEL - Abnormal; Notable for the following components:   CO2 16 (*)    Glucose, Bld 126 (*)    BUN 26 (*)    Creatinine, Ser 1.90 (*)    Albumin 2.3 (*)    AST 106 (*)    Alkaline Phosphatase 164 (*)    Total Bilirubin 24.3 (*)    GFR calc non Af Amer 39 (*)    GFR calc Af Amer 45 (*)    All other components within normal limits  LIPASE, BLOOD - Abnormal; Notable for the following components:   Lipase 73 (*)    All other components within normal limits  URINALYSIS, ROUTINE W REFLEX MICROSCOPIC - Abnormal; Notable for the following components:   Color, Urine BROWN (*)    APPearance CLOUDY (*)    Glucose, UA 100 (*)    Bilirubin Urine LARGE (*)    Leukocytes,Ua TRACE (*)    All other components within normal limits  PROTIME-INR - Abnormal; Notable for the following components:   Prothrombin Time 20.3 (*)    INR 1.8 (*)    All other components within normal limits  AMMONIA - Abnormal; Notable for the following components:   Ammonia 58 (*)    All other components within normal limits  URINALYSIS, MICROSCOPIC (REFLEX) - Abnormal; Notable for the following components:   Bacteria, UA MANY (*)    All other components within normal limits  SARS CORONAVIRUS 2 (HOSPITAL ORDER, [facility])  URINE CULTURE  TSH  TROPONIN I (HIGH SENSITIVITY)  TROPONIN I (HIGH SENSITIVITY)    EKG EKG Interpretation  Date/Time:  Tuesday March 02 2019 10:49:05 EDT  Ventricular Rate:  90 PR Interval:    QRS Duration: 87 QT Interval:  366 QTC Calculation: 448 R Axis:   24 Text Interpretation:  Sinus rhythm When compared to prior, no signifiant changes seen.  No STEMI Confirmed by Antony Blackbird [phone]) on 03/02/2019 11:18:39 AM   Radiology Dg Chest Portable 1 View  Result Date: 03/02/2019 CLINICAL DATA:  Nonproductive cough, congestion. EXAM: PORTABLE CHEST 1 VIEW COMPARISON:  Chest x-ray dated 03/17/2015. FINDINGS: Heart size and mediastinal contours are stable. Lungs are clear. No pleural effusion or pneumothorax seen. Osseous structures about the chest are unremarkable. IMPRESSION: No active disease. No evidence of pneumonia or pulmonary edema. Electronically Signed   By: Franki Cabot M.D.   On: 03/02/2019 12:04    Procedures Procedures (including critical care time)  [facility] was evaluated in Emergency Department on 03/02/2019 for the symptoms described in the history of present illness. He was evaluated in the context of the global COVID-19 pandemic, which necessitated consideration that the patient might be at risk for infection with the SARS-CoV-2 virus that causes COVID-19. Institutional protocols and algorithms that pertain to the evaluation of patients at risk for COVID-19 are in a state of rapid change based on information released by regulatory bodies including the CDC and federal and state organizations. These policies and algorithms were followed during the patient's care in the ED.   Medications Ordered in ED Medications  sodium chloride 0.9 % bolus 1,000 mL (0 mLs Intravenous Stopped 03/02/19 1510)     Initial Impression / Assessment and Plan / ED Course  I have reviewed the triage vital signs and the nursing notes.  Pertinent labs & imaging results that were available during my care of the patient were reviewed by me and considered  in my medical decision making (see chart for details).        [facility] is a 58 y.o.  male with a past medical history significant for hypertension, diabetes, prior anemia, and pancreatitis who presents with fevers, chills, congestion, cough, shortness of breath, malaise, fatigue, and dysuria.  He reports that he was seen by PCP several weeks ago who started him on azithromycin without resolution of his cough.  He reports she has had fevers and chills on and off at home.  He reports that he was tested for coronavirus several weeks ago and was negative but he still concerned about that.  He reports he is had some dysuria develop as well as the nausea, vomiting, and diarrhea.  He reports a decreased appetite and feels dehydrated.  Arrival his blood pressure was 161 systolic.  This is less than his normal by report.  He reports he has no energy and cannot walk very far without getting fatigued.  He denies any other symptoms on arrival.  On exam, patient's blood pressure is around 096 systolic.  Patient is not tachycardic or tachypneic and is on room air maintaining oxygen saturations.  Lungs were clear and chest and back were nontender.  Abdomen was nontender.  Legs were nontender and not significantly swollen.  Mucous membranes were dry on my exam.  Patient does have significant scleral icterus on my exam.  Given patient's constellation of symptoms with his continued cough, chills, fatigue, and urinary symptoms, will check for bacterial infections in the chest or urine.  We will get screening labs.  We will give the patient fluids given his reported feeling of dehydration, nausea vomiting, diarrhea, and decreased appetite.  Blood pressure slightly soft for him.  We will ambulate the patient to see if he gets hypoxic after work-up.  If he does, will likely require admission.  His oxygen saturations remain normal, will likely consider discharge with repeat coronavirus test.  2:02 PM Patient's diagnostic work-up began to return and was abnormal.  Patient's lipase was elevated at 73.  Most  concerning was the patient's bilirubin was 24.  Ammonia elevated and INR elevated at 1.8.  Troponin normal.  Coronavirus test negative.  Patient had some elevation in LFTs such as AST of 106 and alk phos elevation.  Kidney function more elevated at 1.9.  Hemoglobin low at 9.8 and no leukocytosis.  Urinalysis shows trace leuks and bacteria, will send for culture.  No nitrites present.  Chest x-ray shows no pneumonia.  Due to the elevated LFTs relatively and liver dysfunction, gastroenterology was called recommended admission to [facility] and they will see the patient in consultation.  They requested the right upper quadrant ultrasound which was ordered.  Hospitalist team called for admission and they will admit patient.  Patient admitted for further management.   Final Clinical Impressions(s) / ED Diagnoses   Final diagnoses:  Bilirubinemia  Elevated bilirubin  Fatigue, unspecified type   Clinical Impression: 1. Elevated bilirubin   2. Bilirubinemia   3. Fatigue, unspecified type     Disposition: Admit  This note was prepared with assistance of Dragon voice recognition software. Occasional wrong-word or sound-a-like substitutions may have occurred due to the inherent limitations of voice recognition software.     Tegeler, Gwenyth Allegra, MD 03/02/19 [phone]

## 2019-03-02 NOTE — ED Notes (Signed)
ED Provider at bedside. 

## 2019-03-02 NOTE — ED Notes (Signed)
MD at bedside to discuss Admission plan.  Pt in agreement with plan for admission for further workup

## 2019-03-02 NOTE — ED Notes (Signed)
MD at bedside. 

## 2019-03-02 NOTE — ED Notes (Signed)
Paged dr Alfredia Ferguson for dr [facility]

## 2019-03-02 NOTE — ED Triage Notes (Addendum)
Pt states for past month has been having non productive cough, congestion.  Reports chills, saw pain doctor who started him on zithromax and tessalon perles, and steroid without much improvement.  Covid test negative one week ago

## 2019-03-02 NOTE — ED Notes (Signed)
Attempt to place PIV/lab collection unsuccessful, possible need for u/s guided venous access

## 2019-03-03 DIAGNOSIS — I10 Essential (primary) hypertension: Secondary | ICD-10-CM

## 2019-03-03 DIAGNOSIS — E7220 Disorder of urea cycle metabolism, unspecified: Secondary | ICD-10-CM

## 2019-03-03 DIAGNOSIS — R7401 Elevation of levels of liver transaminase levels: Secondary | ICD-10-CM

## 2019-03-03 DIAGNOSIS — R740 Nonspecific elevation of levels of transaminase and lactic acid dehydrogenase [LDH]: Secondary | ICD-10-CM

## 2019-03-03 DIAGNOSIS — E785 Hyperlipidemia, unspecified: Secondary | ICD-10-CM

## 2019-03-03 DIAGNOSIS — D539 Nutritional anemia, unspecified: Secondary | ICD-10-CM

## 2019-03-03 DIAGNOSIS — K828 Other specified diseases of gallbladder: Secondary | ICD-10-CM

## 2019-03-03 LAB — HIV ANTIBODY (ROUTINE TESTING W REFLEX): HIV Screen 4th Generation wRfx: NONREACTIVE

## 2019-03-03 LAB — SARS CORONAVIRUS 2 BY RT PCR (HOSPITAL ORDER, PERFORMED IN ~~LOC~~ HOSPITAL LAB): SARS Coronavirus 2: NEGATIVE

## 2019-03-03 LAB — COMPREHENSIVE METABOLIC PANEL
ALT: 26 U/L (ref 0–44)
AST: 87 U/L — ABNORMAL HIGH (ref 15–41)
Albumin: 2.1 g/dL — ABNORMAL LOW (ref 3.5–5.0)
Alkaline Phosphatase: 142 U/L — ABNORMAL HIGH (ref 38–126)
Anion gap: 6 (ref 5–15)
BUN: 31 mg/dL — ABNORMAL HIGH (ref 6–20)
CO2: 20 mmol/L — ABNORMAL LOW (ref 22–32)
Calcium: 8.4 mg/dL — ABNORMAL LOW (ref 8.9–10.3)
Chloride: 112 mmol/L — ABNORMAL HIGH (ref 98–111)
Creatinine, Ser: 1.89 mg/dL — ABNORMAL HIGH (ref 0.61–1.24)
GFR calc Af Amer: 45 mL/min — ABNORMAL LOW (ref >60)
GFR calc non Af Amer: 39 mL/min — ABNORMAL LOW (ref >60)
Glucose, Bld: 90 mg/dL (ref 70–99)
Potassium: 4.2 mmol/L (ref 3.5–5.1)
Sodium: 138 mmol/L (ref 135–145)
Total Bilirubin: 22.7 mg/dL (ref 0.3–1.2)
Total Protein: 6.5 g/dL (ref 6.5–8.1)

## 2019-03-03 LAB — CREATININE, SERUM
Creatinine, Ser: 1.95 mg/dL — ABNORMAL HIGH (ref 0.61–1.24)
GFR calc Af Amer: 43 mL/min — ABNORMAL LOW (ref >60)
GFR calc non Af Amer: 37 mL/min — ABNORMAL LOW (ref >60)

## 2019-03-03 LAB — PROTIME-INR
INR: 1.7 — ABNORMAL HIGH (ref 0.8–1.2)
Prothrombin Time: 20.2 seconds — ABNORMAL HIGH (ref 11.4–15.2)

## 2019-03-03 LAB — VITAMIN B12: Vitamin B-12: 1350 pg/mL — ABNORMAL HIGH (ref 180–914)

## 2019-03-03 LAB — BILIRUBIN, DIRECT: Bilirubin, Direct: 14 mg/dL — ABNORMAL HIGH (ref 0.0–0.2)

## 2019-03-03 MED ORDER — GUAIFENESIN ER 600 MG PO TB12
600.0000 mg | ORAL_TABLET | Freq: Two times a day (BID) | ORAL | Status: DC
  Administered 2019-03-03 – 2019-03-04 (×3): 600 mg via ORAL
  Filled 2019-03-03 (×3): qty 1

## 2019-03-03 MED ORDER — OXYCODONE HCL 5 MG PO TABS
10.0000 mg | ORAL_TABLET | Freq: Three times a day (TID) | ORAL | Status: DC | PRN
  Administered 2019-03-03 – 2019-03-04 (×3): 10 mg via ORAL
  Filled 2019-03-03 (×3): qty 2

## 2019-03-03 MED ORDER — HYDROCODONE-ACETAMINOPHEN 5-325 MG PO TABS: 2.0000 | ORAL_TABLET | Freq: Once | ORAL | Status: DC

## 2019-03-03 MED ORDER — LACTULOSE 10 GM/15ML PO SOLN
10.0000 g | Freq: Two times a day (BID) | ORAL | Status: DC
  Administered 2019-03-03 – 2019-03-04 (×3): 10 g via ORAL
  Filled 2019-03-03 (×3): qty 30

## 2019-03-03 MED ORDER — SENNOSIDES-DOCUSATE SODIUM 8.6-50 MG PO TABS
1.0000 | ORAL_TABLET | Freq: Two times a day (BID) | ORAL | Status: DC
  Administered 2019-03-03 – 2019-03-04 (×3): 1 via ORAL
  Filled 2019-03-03 (×3): qty 1

## 2019-03-03 MED ORDER — SODIUM CHLORIDE 0.9 % IV SOLN
Freq: Once | INTRAVENOUS | Status: AC
  Administered 2019-03-03: 06:00:00 via INTRAVENOUS

## 2019-03-03 MED ORDER — LIDOCAINE 5 % EX PTCH
1.0000 | MEDICATED_PATCH | CUTANEOUS | Status: DC
  Filled 2019-03-03 (×2): qty 1

## 2019-03-03 NOTE — Progress Notes (Signed)
Patient is admitted this am for elevated lft, no ab pain, no n/v, he does report nonproductive cough for the last few weeks, covid screening test is negative Gi consulted for elevated lft, input appreciated , will follow recommendation.

## 2019-03-03 NOTE — Progress Notes (Signed)
GI preliminary note:  Patient seen and examined.  Full dictated note to follow.  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  I think discharge tomorrow might be possible if downward trend in liver chemistries continues.  The patient's chief complaint continues to be his persistent cough, although presumably that could continue to be managed as an outpatient.  Cleotis Nipper, M.D. Pager [phone] If no answer or after 5 PM call [phone]

## 2019-03-03 NOTE — H&P (Signed)
History and Physical  Alec Chylendrew L Tanksley ONG:[id number] DOB: 09/15/1960 DOA: 03/02/2019  Referring physician: Lynden Oxfordegeler Christopher MD PCP: Truett PernaWang, Yun, MD  [facility]  Chief Complaint: Cough and weakness  HPI: Alec Gibbs is a 58 y.o. male with medical history significant for hypertension, hyperlipidemia, type 2 diabetes mellitus, arthritis and pancreatitis who [facility] due to several weeks of dry cough with occasional chills but no fever, abdominal pain or diarrhea.  Patient states that he has been taking ginger tea mixed with vodka about 2 times weekly since last 3 weeks with the hope that it will suppress his cough.  He decided to go to ED since the cough was not improving.  ED Course: In the emergency department, vital signs are within normal range. Work-up in the ED showed elevated bilirubin at 24.3, AST was elevated at 116, ammonia 58,  INR was 1.8.  H&H 9.8/29.1 and elevated BUN/creatinine at 26/1.90 (baseline creatinine unknown). SARS coronavirus 2 was negative. Right upper quadrant ultrasound showed gallbladder sludge with wall thickening measuring 4.7 mm with mild pericholecystic fluid.  IV hydration Zofran and morphine were given.  Gastroenterologist (Dr.Buccini) was consulted and recommended hospitalist admission  Review of Systems: Constitutional: Loose and fatigue.  Negative  fever.  HENT: Negative for ear pain and sore throat.   Eyes: Negative for pain and visual disturbance.  Respiratory: Cough.  Negative for chest tightness and shortness of breath.   Cardiovascular: Negative for chest pain and palpitations.  Gastrointestinal: Nausea and occasional vomiting.  Negative for abdominal pain  Endocrine: Negative for polyphagia and polyuria.  Genitourinary: Negative for decreased urine volume Musculoskeletal: Negative for arthralgias and back pain.  Skin: Negative for color change and rash.  Allergic/Immunologic: Negative for immunocompromised state.   Neurological: Negative for tremors, syncope, speech difficulty, weakness, light-headedness and headaches.  Hematological: Does not bruise/bleed easily.    Past Medical History:  Diagnosis Date  . Anemia   . Arthritis   . Diabetes mellitus without complication (HCC)   . History of hiatal hernia   . Hypertension   . Pancreatitis 2015   Past Surgical History:  Procedure Laterality Date  . CARPAL TUNNEL RELEASE Bilateral    1996 and 2015  . CYSTOSCOPY  03/22/2015   Procedure: BEDSIDE CYSTOSCOPY;  Surgeon: Alfredo MartinezScott MacDiarmid, MD;  [facility];  Service: Urology;;  . Bluford KaufmannYSTOSCOPY WITH URETHRAL DILATATION  03/22/2015   Procedure: CYSTOSCOPY WITH URETHRAL BALLOON DILATATION;  Surgeon: Alfredo MartinezScott MacDiarmid, MD;  [facility];  Service: Urology;;  . ELBOW SURGERY  2014   both elbows (separate surgeries)  . EYE SURGERY Right 2010   bleed due to diabetes  . FINGER SURGERY  2014   right ring and middle finger  . KNEE ARTHROSCOPY Right 2014  . REPLACEMENT TOTAL KNEE Left 2013    Social History:  reports that he has quit smoking. He quit smokeless tobacco use about 13 years ago. He reports current alcohol use. He reports that he does not use drugs.   Allergies  Allergen Reactions  . Lisinopril Other (See Comments)    cough  . Tramadol Itching    History reviewed. No pertinent family history.    Prior to Admission medications   Medication Sig Start Date End Date Taking? Authorizing Provider  allopurinol (ZYLOPRIM) 100 MG tablet Take 100 mg by mouth daily.  01/17/15  Yes [provider]  amLODipine (NORVASC) 5 MG tablet Take 5 mg by mouth daily.   Yes [provider]  atorvastatin (LIPITOR) 80 MG tablet Take 40 mg by mouth 2 (two) times a day.   Yes [provider]  LEVEMIR FLEXTOUCH 100 UNIT/ML Pen Inject 20 Units into the skin daily as needed (BS).  03/01/15  Yes [provider]  losartan (COZAAR) 50 MG tablet Take 50 mg by mouth daily.   Yes  [provider]  metoprolol succinate (TOPROL-XL) 25 MG 24 hr tablet Take 25 mg by mouth daily.   Yes [provider]  oxyCODONE-acetaminophen (PERCOCET) 10-325 MG tablet Take 1 tablet by mouth 3 (three) times daily.    Yes [provider]  pantoprazole (PROTONIX) 40 MG tablet Take 40 mg by mouth 2 (two) times daily as needed (indigestion).   Yes [provider]  ACCU-CHEK AVIVA PLUS test strip 1 each by Other route 3 (three) times daily.  03/02/15   [provider]  atenolol (TENORMIN) 100 MG tablet Take 100 mg by mouth daily.    [provider]  B-D UF III MINI PEN NEEDLES 31G X 5 MM MISC Inject 1 each into the skin every evening.  03/01/15   [provider]  colchicine 0.6 MG tablet Take 0.6 mg by mouth daily.    [provider]  CRESTOR 40 MG tablet Take 40 mg by mouth daily. 12/08/14   [provider]    Physical Exam: BP 124/64 (BP Location: Left Arm)   Pulse 85   Temp 98.2 F (36.8 C) (Oral)   Resp 18   Ht 5\' 11"  (1.803 m)   Wt 118.8 kg   SpO2 99%   BMI 36.54 kg/m   . General: 58 y.o. year-old male well developed well nourished in no acute distress.  Alert and oriented x3. Marland Kitchen. HEENT: Normocephalic, atraumatic.  Pupils equal round and reactive to light and accommodation, scleral icterus noted in both eyes. . Cardiovascular: Regular rate and rhythm with no rubs or gallops.  No thyromegaly or JVD noted.  No lower extremity edema. 2/4 pulses in all 4 extremities. Marland Kitchen. Respiratory: Clear to auscultation with no wheezes or rales. Good inspiratory effort. . Abdomen: Soft nontender nondistended with normal bowel sounds x4 quadrants. . Muskuloskeletal: No cyanosis, clubbing or edema noted bilaterally . Neuro: CN II-XII intact, strength, sensation, reflexes . Skin: Jaundiced.  No ulcerative lesions noted or rashes . Psychiatry: Judgement and insight appear normal. Mood is appropriate for condition and setting           Labs on Admission:  Basic Metabolic Panel: Recent Labs  Lab 03/02/19 1223 03/03/19 0440  NA 137 138  K 4.6 4.2  CL 109 112*  CO2 16* 20*  GLUCOSE 126* 90  BUN 26* 31*  CREATININE 1.90* 1.89*  1.95*  CALCIUM 8.9 8.4*   Liver Function Tests: Recent Labs  Lab 03/02/19 1223 03/03/19 0440  AST 106* 87*  ALT 28 26  ALKPHOS 164* 142*  BILITOT 24.3* 22.7*  PROT 7.3 6.5  ALBUMIN 2.3* 2.1*   Recent Labs  Lab 03/02/19 1223  LIPASE 73*   Recent Labs  Lab 03/02/19 1315  AMMONIA 58*   CBC: Recent Labs  Lab 03/02/19 1223  WBC 6.6  NEUTROABS 4.0  HGB 9.8*  HCT 29.1*  MCV 105.4*  PLT 213   Cardiac Enzymes: No results for input(s): CKTOTAL, CKMB, CKMBINDEX, TROPONINI in the last 168 hours.  BNP (last 3 results) No results for input(s): BNP in the last 8760 hours.  ProBNP (last 3 results) No results for input(s): PROBNP  in the last 8760 hours.  CBG: No results for input(s): GLUCAP in the last 168 hours.  Radiological Exams on Admission: Dg Chest Portable 1 View  Result Date: 03/02/2019 CLINICAL DATA:  Nonproductive cough, congestion. EXAM: PORTABLE CHEST 1 VIEW COMPARISON:  Chest x-ray dated 03/17/2015. FINDINGS: Heart size and mediastinal contours are stable. Lungs are clear. No pleural effusion or pneumothorax seen. Osseous structures about the chest are unremarkable. IMPRESSION: No active disease. No evidence of pneumonia or pulmonary edema. Electronically Signed   By: Franki Cabot M.D.   On: 03/02/2019 12:04   US Abdomen Limited Ruq  Result Date: 03/02/2019 CLINICAL DATA:  Bilirubinemia.  History pancreatitis. EXAM: ULTRASOUND ABDOMEN LIMITED RIGHT UPPER QUADRANT COMPARISON:  CT 03/21/2017 FINDINGS: Gallbladder: Moderate gallbladder sludge with mild gallbladder wall thickening measuring 4.7 mm. Small amount of pericholecystic fluid. 6 mm gallbladder polyp is present. Initially measured focal echogenic material along the gallbladder wall was noted to move  during the exam compatible with sludge. Common bile duct: Diameter: 2.8 mm. Liver: Mild increased parenchymal echogenicity with slight contour nodularity suggesting cirrhosis. Portal vein is patent on color Doppler imaging with normal direction of blood flow towards the liver. IMPRESSION: Gallbladder sludge with wall thickening measuring 4.7 mm and mild pericholecystic fluid. No definite stones visualized. Recommend clinical correlation for acute cholecystitis. HIDA scan may be helpful for further evaluation. 6 mm gallbladder polyp. Findings suggesting mild cirrhosis. Electronically Signed   By: Marin Olp M.D.   On: 03/02/2019 17:15    EKG: I independently viewed the EKG done and my findings are as followed: Sinus rhythm at rate of 90bpm  Assessment/Plan Present on Admission: . Bilirubinemia  Principal Problem:   Bilirubinemia Active Problems:   Acute kidney injury   Macrocytic anemia   Gallbladder sludge   Essential hypertension   Dyslipidemia   Transaminitis  Bilirubinemia Patient presents with total bilirubin of 24.3, he denies any abdominal pain. RUQ ultrasound shows slight contour nodularity suggesting cirrhosis Direct bilirubin will be checked Hepatitis panel will be checked Continue to monitor patient accordingly  Biliary sludge RUQ U/S showed gallbladder sludge with wall thickening measuring 4.7 mm and mild pericholecystic fluid.  Consider HIDA scan.  Elevated transaminitis AST 106, ALP 164 This is possibly secondary to above Continue to monitor liver panel  Hyperammonemia NH3 level 58, patient was alert and oriented x3 Continue lactulose   Acute kidney injury Creatinine on admission= 1.90, baseline creatinine = unknown,  Renally adjust medications, avoid nephrotoxic agents/dehydration/hypotension   Essential hypertension Continue home meds  Dyslipidemia Hold statin at this time  Maicrocytic anemia H/H9 0.8/29.1, MCV 105.1 Folate and B12 will be checked   DVT prophylaxis: Subcu heparin  Code Status: Full code  Family Communication: None at bedside  Disposition Plan: Discharge home once clinically stable  Consults called: Gastroenterologist(Dr Buccini) per medical record  Admission status: Inpatient   Bernadette Hoit MD [facility]  If 7PM-7AM, please contact night-coverage [email or web address] 03/03/2019, 5:37 AM

## 2019-03-03 NOTE — Progress Notes (Signed)
CRITICAL VALUE ALERT  Critical Value:  Bilirubin 22.7   Date & Time Notied:  03/03/2019 0533  Provider Notified:K shorr Orders Received/Actions taken: See new orders. Will continue to monitor patient

## 2019-03-03 NOTE — Consult Note (Signed)
Referring Provider:  Dr. Albertine GratesFang Xu Primary Care Physician:  Truett PernaWang, Yun, MD Primary Gastroenterologist:  None (unassigned)  Reason for Consultation:  Elevated LFT's  HPI: Alec Gibbs is a 58 y.o. male w/ h/o Etoh-related pancreatitis several years ago, [place] in [place], where [place] lives, having undergone a couple of colonoscopies in the past.  [facility] b/o a cough of several weeks duration, s/p Rx w/ cefdinir on June 26 (per d/w [facility] [facility]) and more recently w/ Z-pak.  [facility], pt was noted to be severely jaundiced.  Lab work showed a bilirubin of 25, prompting discussion with me which led to a decision for admission and evaluation.  In retrospect, the patient notes a several week history of dark urine.  The patient has no known history of hepatitis or other known liver disease.  However, his ultrasound showed a slightly nodular surface of the liver suggesting possible cirrhosis.  There was some slight pericholecystic fluid, a normal caliber CBD, and a small gallbladder polyp but no stones.  Note that the patient has not had any biliary tract symptoms.  Overnight, his bilirubin has come down a small amount.  Liver chemistries show an OT/PT split with an AST of 87 and ALT 26, suggestive of perhaps some element of residual alcohol related liver inflammation even though he has not had recent alcohol.  Direct bilirubin is 14 with total bilirubin 22.7 today.  Overall, the patient insists that he continues to feel fine, except for his cough.   Past Medical History:  Diagnosis Date  . Anemia   . Arthritis   . Diabetes mellitus without complication (HCC)   . History of hiatal hernia   . Hypertension   . Pancreatitis 2015    Past Surgical History:  Procedure Laterality Date  . CARPAL TUNNEL RELEASE Bilateral    1996 and 2015  . CYSTOSCOPY  03/22/2015   Procedure: BEDSIDE CYSTOSCOPY;  Surgeon: Alfredo MartinezScott MacDiarmid, MD;   [facility];  Service: Urology;;  . Bluford KaufmannYSTOSCOPY WITH URETHRAL DILATATION  03/22/2015   Procedure: CYSTOSCOPY WITH URETHRAL BALLOON DILATATION;  Surgeon: Alfredo MartinezScott MacDiarmid, MD;  [facility];  Service: Urology;;  . ELBOW SURGERY  2014   both elbows (separate surgeries)  . EYE SURGERY Right 2010   bleed due to diabetes  . FINGER SURGERY  2014   right ring and middle finger  . KNEE ARTHROSCOPY Right 2014  . REPLACEMENT TOTAL KNEE Left 2013    Prior to Admission medications   Medication Sig Start Date End Date Taking? Authorizing Provider  allopurinol (ZYLOPRIM) 100 MG tablet Take 100 mg by mouth daily.  01/17/15  Yes [provider]  amLODipine (NORVASC) 5 MG tablet Take 5 mg by mouth daily.   Yes [provider]  atorvastatin (LIPITOR) 80 MG tablet Take 40 mg by mouth 2 (two) times a day.   Yes [provider]  LEVEMIR FLEXTOUCH 100 UNIT/ML Pen Inject 20 Units into the skin daily as needed (BS).  03/01/15  Yes [provider]  losartan (COZAAR) 50 MG tablet Take 50 mg by mouth daily.   Yes [provider]  metoprolol succinate (TOPROL-XL) 25 MG 24 hr tablet Take 25 mg by mouth daily.   Yes [provider]  oxyCODONE-acetaminophen (PERCOCET) 10-325 MG tablet Take 1 tablet by mouth 3 (three) times daily.    Yes [provider]  pantoprazole (PROTONIX) 40 MG tablet Take 40 mg by mouth 2 (  two) times daily as needed (indigestion).   Yes [provider]  ACCU-CHEK AVIVA PLUS test strip 1 each by Other route 3 (three) times daily.  03/02/15   [provider]  atenolol (TENORMIN) 100 MG tablet Take 100 mg by mouth daily.    [provider]  B-D UF III MINI PEN NEEDLES 31G X 5 MM MISC Inject 1 each into the skin every evening.  03/01/15   [provider]  colchicine 0.6 MG tablet Take 0.6 mg by mouth daily.    [provider]  CRESTOR 40 MG tablet Take 40 mg by mouth daily. 12/08/14   [provider]    Current Facility-Administered Medications  Medication Dose Route Frequency Provider Last Rate Last Dose  . guaiFENesin (MUCINEX) 12 hr tablet 600 mg  600 mg Oral BID Albertine GratesXu, Fang, MD   600 mg at 03/03/19 1157  . heparin injection 5,000 Units  5,000 Units Subcutaneous Q8H Adefeso, Oladapo, DO      . HYDROcodone-acetaminophen (NORCO/VICODIN) 5-325 MG per tablet 2 tablet  2 tablet Oral Once Schorr, Roma KayserKatherine P, NP      . lactulose (CHRONULAC) 10 GM/15ML solution 10 g  10 g Oral BID Adefeso, Oladapo, DO   10 g at 03/03/19 1157  . lidocaine (LIDODERM) 5 % 1 patch  1 patch Transdermal Q24H Albertine GratesXu, Fang, MD      . oxyCODONE (Oxy IR/ROXICODONE) immediate release tablet 10 mg  10 mg Oral Q8H PRN Albertine GratesXu, Fang, MD   10 mg at 03/03/19 1157  . senna-docusate (Senokot-S) tablet 1 tablet  1 tablet Oral BID Albertine GratesXu, Fang, MD   1 tablet at 03/03/19 1157    Allergies as of 03/02/2019 - Review Complete 03/02/2019  Allergen Reaction Noted  . Lisinopril Other (See Comments) 03/16/2015  . Tramadol Itching 03/16/2015    History reviewed. No pertinent family history.  Social History   Socioeconomic History  . Marital status: Single    Spouse name: Not on file  . Number of children: Not on file  . Years of education: Not on file  . Highest education level: Not on file  Occupational History  . Not on file  Social Needs  . Financial resource strain: Not on file  . Food insecurity    Worry: Not on file    Inability: Not on file  . Transportation needs    Medical: Not on file    Non-medical: Not on file  Tobacco Use  . Smoking status: Former Games developermoker  . Smokeless tobacco: Former NeurosurgeonUser    Quit date: 03/16/2005  Substance and Sexual Activity  . Alcohol use: Yes    Comment: 3 beers in 7 days  . Drug use: No  . Sexual activity: Not on file  Lifestyle  . Physical activity    Days per week: Not on file    Minutes per session: Not on file  . Stress: Not on file  Relationships  . Social Wellsite geologistconnections     Talks on phone: Not on file    Gets together: Not on file    Attends religious service: Not on file    Active member of club or organization: Not on file    Attends meetings of clubs or organizations: Not on file    Relationship status: Not on file  . Intimate partner violence    Fear of current or ex partner: Not on file    Emotionally abused: Not on file    Physically abused: Not  on file    Forced sexual activity: Not on file  Other Topics Concern  . Not on file  Social History Narrative  . Not on file    Review of Systems: The patient states he is on disability because of gouty arthritis but has not had recent joint effusions, leg swelling, or skin rashes.  No chest pain or shortness of breath, just the chronic dry cough mentioned in the HPI, COVID negative.  No dysphagia.  Appetite is not as good as it used to be but there is no frank anorexia.  He does indicate he has had some mild weight loss noted through the pain center where he gets his pain medication.  He has noticed dark urine for several weeks but no dysuria.  No swollen lymph nodes.  Physical Exam: Vital signs in last 24 hours: Temp:  [98.2 F (36.8 C)-98.8 F (37.1 C)] 98.3 F (36.8 C) (07/22 0710) Pulse Rate:  [83-98] 83 (07/22 0710) Resp:  [16-18] 18 (07/21 2058) BP: (108-124)/(60-94) 115/65 (07/22 0710) SpO2:  [97 %-100 %] 98 % (07/22 0710) Last BM Date: 03/02/19 General:   Alert,  Well-developed, well-nourished, pleasant and cooperative in NAD Head:  Normocephalic and atraumatic. Eyes:  Sclera clear, some icterus probably present but not as prominent as I expected.   Conjunctiva pink. Lungs:  Clear throughout to auscultation.   No wheezes, crackles, or rhonchi. No evident respiratory distress. Heart:   Regular rate and rhythm; no murmurs, clicks, rubs,  or gallops. Abdomen:  Soft, nontender, nontympanitic, and nondistended. No masses, hepatosplenomegaly or ventral hernias noted. Normal bowel sounds, without  bruits, guarding, or rebound.  No evident ascites, flank tympany present.  Msk:   Symmetrical without gross deformities. Extremities:   Without clubbing, cyanosis, or edema. Neurologic:  Alert and coherent;  grossly normal neurologically. Skin:  Intact without significant lesions or rashes. Psych:   Alert and cooperative. Normal mood and affect.  Intake/Output from previous day: 07/21 0701 - 07/22 0700 In: -  Out: 250 [Urine:250] Intake/Output this shift: Total I/O In: -  Out: 450 [Urine:450]  Lab Results: Recent Labs    03/02/19 1223  WBC 6.6  HGB 9.8*  HCT 29.1*  PLT 213   BMET Recent Labs    03/02/19 1223 03/03/19 0440  NA 137 138  K 4.6 4.2  CL 109 112*  CO2 16* 20*  GLUCOSE 126* 90  BUN 26* 31*  CREATININE 1.90* 1.89*  1.95*  CALCIUM 8.9 8.4*   LFT Recent Labs    03/03/19 0440  PROT 6.5  ALBUMIN 2.1*  AST 87*  ALT 26  ALKPHOS 142*  BILITOT 22.7*  BILIDIR 14.0*   PT/INR Recent Labs    03/02/19 1224 03/03/19 0440  LABPROT 20.3* 20.2*  INR 1.8* 1.7*    Studies/Results: Dg Chest Portable 1 View  Result Date: 03/02/2019 CLINICAL DATA:  Nonproductive cough, congestion. EXAM: PORTABLE CHEST 1 VIEW COMPARISON:  Chest x-ray dated 03/17/2015. FINDINGS: Heart size and mediastinal contours are stable. Lungs are clear. No pleural effusion or pneumothorax seen. Osseous structures about the chest are unremarkable. IMPRESSION: No active disease. No evidence of pneumonia or pulmonary edema. Electronically Signed   By: Bary RichardStan  Maynard M.D.   On: 03/02/2019 12:04   [place] Abdomen Limited Ruq  Result Date: 03/02/2019 CLINICAL DATA:  Bilirubinemia.  History pancreatitis. EXAM: ULTRASOUND ABDOMEN LIMITED RIGHT UPPER QUADRANT COMPARISON:  CT 03/21/2017 FINDINGS: Gallbladder: Moderate gallbladder sludge with mild gallbladder wall thickening measuring 4.7 mm. Small amount of  pericholecystic fluid. 6 mm gallbladder polyp is present. Initially measured focal echogenic material  along the gallbladder wall was noted to move during the exam compatible with sludge. Common bile duct: Diameter: 2.8 mm. Liver: Mild increased parenchymal echogenicity with slight contour nodularity suggesting cirrhosis. Portal vein is patent on color Doppler imaging with normal direction of blood flow towards the liver. IMPRESSION: Gallbladder sludge with wall thickening measuring 4.7 mm and mild pericholecystic fluid. No definite stones visualized. Recommend clinical correlation for acute cholecystitis. HIDA scan may be helpful for further evaluation. 6 mm gallbladder polyp. Findings suggesting mild cirrhosis. Electronically Signed   By: Marin Olp M.D.   On: 03/02/2019 17:15    Impression: 1.  Severe hyperbilirubinemia, suggestive of drug-induced cholestasis, most likely from his cefnidir based on time course (after speaking with his pharmacy and obtaining the date it was dispensed) and reported side effect of cholestasis with that medication.  2.  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  However, no ascites or thrombocytopenia to suggest decompensated cirrhosis.  3.  Previous history of pancreatitis, presumably related to prior ethanol consumption.  4.  Chronic cough, which is patient's chief complaint, etiology unclear.  Plan: 1.  Check hepatitis panel 2.  Follow labs.  If stable or improved tomorrow, okay for discharge from GI standpoint although the patient's chronic cough issue remains unresolved and will need further attention.   LOS: 1 day   [place]  03/03/2019, 1:15 PM   Pager [phone] If no answer or after 5 PM call [phone]

## 2019-03-04 DIAGNOSIS — D638 Anemia in other chronic diseases classified elsewhere: Secondary | ICD-10-CM

## 2019-03-04 DIAGNOSIS — E119 Type 2 diabetes mellitus without complications: Secondary | ICD-10-CM

## 2019-03-04 LAB — CBC WITH DIFFERENTIAL/PLATELET
Abs Immature Granulocytes: 0.17 10*3/uL — ABNORMAL HIGH (ref 0.00–0.07)
Basophils Absolute: 0.1 10*3/uL (ref 0.0–0.1)
Basophils Relative: 1 %
Eosinophils Absolute: 0.2 10*3/uL (ref 0.0–0.5)
Eosinophils Relative: 3 %
HCT: 26.2 % — ABNORMAL LOW (ref 39.0–52.0)
Hemoglobin: 8.7 g/dL — ABNORMAL LOW (ref 13.0–17.0)
Immature Granulocytes: 3 %
Lymphocytes Relative: 18 %
Lymphs Abs: 1 10*3/uL (ref 0.7–4.0)
MCH: 36.1 pg — ABNORMAL HIGH (ref 26.0–34.0)
MCHC: 33.2 g/dL (ref 30.0–36.0)
MCV: 108.7 fL — ABNORMAL HIGH (ref 80.0–100.0)
Monocytes Absolute: 1.1 10*3/uL — ABNORMAL HIGH (ref 0.1–1.0)
Monocytes Relative: 19 %
Neutro Abs: 3.2 10*3/uL (ref 1.7–7.7)
Neutrophils Relative %: 56 %
Platelets: 199 10*3/uL (ref 150–400)
RBC: 2.41 MIL/uL — ABNORMAL LOW (ref 4.22–5.81)
RDW: 19.6 % — ABNORMAL HIGH (ref 11.5–15.5)
WBC: 5.7 10*3/uL (ref 4.0–10.5)
nRBC: 0 % (ref 0.0–0.2)

## 2019-03-04 LAB — URINE CULTURE: Culture: >=100000 — AB

## 2019-03-04 LAB — COMPREHENSIVE METABOLIC PANEL
ALT: 26 U/L (ref 0–44)
AST: 104 U/L — ABNORMAL HIGH (ref 15–41)
Albumin: 2.1 g/dL — ABNORMAL LOW (ref 3.5–5.0)
Alkaline Phosphatase: 150 U/L — ABNORMAL HIGH (ref 38–126)
Anion gap: 10 (ref 5–15)
BUN: 27 mg/dL — ABNORMAL HIGH (ref 6–20)
CO2: 15 mmol/L — ABNORMAL LOW (ref 22–32)
Calcium: 8.6 mg/dL — ABNORMAL LOW (ref 8.9–10.3)
Chloride: 112 mmol/L — ABNORMAL HIGH (ref 98–111)
Creatinine, Ser: 1.83 mg/dL — ABNORMAL HIGH (ref 0.61–1.24)
GFR calc Af Amer: 47 mL/min — ABNORMAL LOW (ref >60)
GFR calc non Af Amer: 40 mL/min — ABNORMAL LOW (ref >60)
Glucose, Bld: 91 mg/dL (ref 70–99)
Potassium: 4 mmol/L (ref 3.5–5.1)
Sodium: 137 mmol/L (ref 135–145)
Total Bilirubin: 23.2 mg/dL (ref 0.3–1.2)
Total Protein: 6.5 g/dL (ref 6.5–8.1)

## 2019-03-04 LAB — PROTIME-INR
INR: 1.7 — ABNORMAL HIGH (ref 0.8–1.2)
Prothrombin Time: 19.9 seconds — ABNORMAL HIGH (ref 11.4–15.2)

## 2019-03-04 LAB — HEPATITIS PANEL, ACUTE
HCV Ab: 0.1 s/co ratio (ref 0.0–0.9)
Hep A IgM: NEGATIVE
Hep B C IgM: NEGATIVE
Hepatitis B Surface Ag: NEGATIVE

## 2019-03-04 LAB — FOLATE RBC
Folate, Hemolysate: 359 ng/mL
Folate, RBC: 1596 ng/mL (ref >498)
Hematocrit: 22.5 % — ABNORMAL LOW (ref 37.5–51.0)

## 2019-03-04 LAB — HEMATOLOGY COMMENTS:

## 2019-03-04 MED ORDER — ONDANSETRON HCL 4 MG/2ML IJ SOLN
4.0000 mg | Freq: Four times a day (QID) | INTRAMUSCULAR | Status: DC | PRN
  Administered 2019-03-04: 4 mg via INTRAVENOUS
  Filled 2019-03-04: qty 2

## 2019-03-04 MED ORDER — GUAIFENESIN ER 600 MG PO TB12: 600.0000 mg | ORAL_TABLET | Freq: Two times a day (BID) | ORAL | 0 refills | Status: AC

## 2019-03-04 MED ORDER — DOXYCYCLINE HYCLATE 100 MG PO TABS
100.0000 mg | ORAL_TABLET | Freq: Two times a day (BID) | ORAL | Status: DC
  Administered 2019-03-04: 100 mg via ORAL
  Filled 2019-03-04: qty 1

## 2019-03-04 MED ORDER — METHOCARBAMOL 1000 MG/10ML IJ SOLN
500.0000 mg | Freq: Once | INTRAVENOUS | Status: DC
  Filled 2019-03-04: qty 5

## 2019-03-04 MED ORDER — METHOCARBAMOL 500 MG PO TABS
500.0000 mg | ORAL_TABLET | ORAL | Status: AC
  Administered 2019-03-04: 500 mg via ORAL
  Filled 2019-03-04: qty 1

## 2019-03-04 MED ORDER — DOXYCYCLINE HYCLATE 100 MG PO TABS: 100.0000 mg | ORAL_TABLET | Freq: Two times a day (BID) | ORAL | 0 refills | Status: AC

## 2019-03-04 NOTE — Progress Notes (Signed)
Discussed case w/ Dr. Erlinda Hong and we agree that pt is stable for dischg.  His Bilirubin level today is about the same as yesterday.  With neg Hepatitis serologies and no evid of hepatic lesions or biliary obstrn, I do feel quite sure (esp in absence of sx) that pt's hyperbilirubinemia is the consequence of his Cefdinir medication given on June 26.  Agree w/ plan for f/u w/ his primary gastroenterologist, Dr. Reather Laurence.  Cleotis Nipper, M.D. Pager [phone] If no answer or after 5 PM call [phone]

## 2019-03-04 NOTE — Discharge Summary (Addendum)
Discharge Summary  Alec Gibbs WUJ:[id number] DOB: 05/15/1961  PCP: Truett PernaWang, Yun, MD  Admit date: 03/02/2019 Discharge date: 03/04/2019  Time spent: 30mins, more than 50% time spent on coordination of care.  Recommendations for Outpatient Follow-up:  1. F/u with PCP within a week  for hospital discharge follow up, repeat cbc/bmp at follow up 2. F/u with GI Dr Loman Chromanhoton to monitor liver function and gallbladder sludge  3. F/u with pulmonology for chronic cough 4. F/u with nephrology for CKD  Discharge Diagnoses:  Active Hospital Problems   Diagnosis Date Noted   Bilirubinemia 03/02/2019   Acute kidney injury 03/03/2019   Macrocytic anemia 03/03/2019   Gallbladder sludge 03/03/2019   Essential hypertension 03/03/2019   Dyslipidemia 03/03/2019   Transaminitis 03/03/2019    Resolved Hospital Problems  No resolved problems to display.    Discharge Condition: stable  Diet recommendation: heart healthy/carb modified  Filed Weights   03/02/19 1043  Weight: 118.8 kg    History of present illness: (per admitting MD Dr Thomes DinningAdefeso) PCP: Truett PernaWang, Yun, MD  Patient [facility]: [facility]  Chief Complaint: Cough and weakness  HPI: Alec Gibbs is a 58 y.o. male with medical history significant for hypertension, hyperlipidemia, type 2 diabetes mellitus, arthritis and pancreatitis who [facility] due to several weeks of dry cough with occasional chills but no fever, abdominal pain or diarrhea.  Patient states that he has been taking ginger tea mixed with vodka about 2 times weekly since last 3 weeks with the hope that it will suppress his cough.  He decided to go to ED since the cough was not improving.  ED Course: In the emergency department, vital signs are within normal range. Work-up in the ED showed elevated bilirubin at 24.3, AST was elevated at 116, ammonia 58,  INR was 1.8.  H&H 9.8/29.1 and elevated BUN/creatinine at 26/1.90 (baseline creatinine unknown). SARS  coronavirus 2 was negative. Right upper quadrant ultrasound showed gallbladder sludge with wall thickening measuring 4.7 mm with mild pericholecystic fluid.  IV hydration Zofran and morphine were given.  Gastroenterologist (Dr.Buccini) was consulted and recommended hospitalist admission  Hospital Course:  Principal Problem:   Bilirubinemia Active Problems:   Acute kidney injury   Macrocytic anemia   Gallbladder sludge   Essential hypertension   Dyslipidemia   Transaminitis  Severe hyperbilirubinemia with elevated transaminitis (ast 104) -no gi symptom, he denies ab pain, no n/v, with good appetite -ab us with gallbladder sludge /gallbladder wall thickening but he has no pain -INR 1.7 with normal platelet  -acute hepatitis panel negative -he does reports drinking alcohol weekly -patient is requiring inpatient monitor due to sever hyperbilirubinemia  -GI Dr Nicholes MangoBiccini consulted, input appreciated, though "severe hyperbilirubinemia, suggestive of drug-induced cholestasis, most likely from his cefnidir based on time course" and "  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  Mildly elevated ammonia level, no confusion.  Previous history of pancreatitis, presumably related to prior ethanol consumption, currently no ab pain  CKD III/anemia of chronic disease, he does has macrocytosis, suspect due to alcohol  hgb 8.7, reports stool is brown, no sign of bleeding, f/u with pcp and nephrology  Cr 1.8-1.9 range, he reports is followed by nephrology Dr Ernestina ColumbiaSaqid in two weeks    Possible lower UTI, he reports mild burning sensation with urination, no fever, no leukocytosis  urine  culture + staph epi sensitive to doxycycline, he is prescribed doxycycline for three daya, f/u with pcp and urology if difficulty urination   Chronic cough, cxr clear, no fever,  covid test negative. Acid reflux? Allergy? Refer to pulmonology  He reports has cough when he was on lisinopril, I wonder cozaar contribute to current chronic cough as well. Will stop cozaar  HTN: bp stable without any bp meds while hospitalized Stop norvasc and cozaar (reason for chronic cough?),  continue home meds metoprolol     noninsulin dependent dm2, he reports using levemir as needed , blood glucose stable in the hospital, f/u with pcp  H/o gout no acute issues, continue home meds  he is followed bu rheumatology   Chronic back pain, he is followed with pain clinic and has a appointment in two weeks  Body mass index is 36.54 kg/m. He reports has been trying to loose weight, his pcp has been cutting down diabetes and hypertension meds  Procedures:  none  Consultations:  GI Dr Matthias HughsBuccini  Discharge Exam: BP 117/60 (BP Location: Right Arm)    Pulse 88    Temp 98.9 F (37.2 C) (Oral)    Resp 20    Ht 5\' 11"  (1.803 m)    Wt 118.8 kg    SpO2 99%    BMI 36.54 kg/m   General: NAD Cardiovascular: RRR Respiratory: CTABL  Discharge Instructions You were cared for by a hospitalist during your hospital stay. If you have any questions about your discharge medications or the care you received while you were in the hospital after you are discharged, you can call the unit and asked to speak with the hospitalist on call if the hospitalist that took care of you is not available. Once you are discharged, your primary care physician will handle any further medical issues. Please note that NO REFILLS for any discharge medications will be authorized once you are discharged, as it is imperative that you return to your primary care physician (or establish a relationship with a primary care physician if you do not have one) for your aftercare needs so that they can reassess your need for medications and monitor your lab values.  Discharge Instructions    Diet Carb Modified   Complete by: As  directed    Increase activity slowly   Complete by: As directed      Allergies as of 03/04/2019      Reactions   Lisinopril Other (See Comments)   cough   Tramadol Itching      Medication List    STOP taking these medications   amLODipine 5 MG tablet Commonly known as: NORVASC   atenolol 100 MG tablet Commonly known as: TENORMIN   colchicine 0.6 MG tablet   Crestor 40 MG tablet Generic drug: rosuvastatin   losartan 50 MG tablet Commonly known as: COZAAR     TAKE these medications   Accu-Chek Aviva Plus test strip Generic drug: glucose blood 1 each by Other route 3 (three) times daily.   allopurinol 100 MG tablet Commonly known as: ZYLOPRIM Take 100 mg by mouth daily.   atorvastatin 80 MG tablet Commonly known as: LIPITOR Take 40 mg by mouth 2 (two) times a day.   B-D UF III MINI PEN NEEDLES 31G X 5 MM Misc Generic drug: Insulin Pen Needle Inject 1 each into the skin every evening.   doxycycline 100 MG tablet Commonly known as: VIBRA-TABS Take 1 tablet (100 mg total) by mouth every 12 (  twelve) hours.   guaiFENesin 600 MG 12 hr tablet Commonly known as: MUCINEX Take 1 tablet (600 mg total) by mouth 2 (two) times daily.   Levemir FlexTouch 100 UNIT/ML Pen Generic drug: Insulin Detemir Inject 20 Units into the skin daily as needed (BS).   metoprolol succinate 25 MG 24 hr tablet Commonly known as: TOPROL-XL Take 25 mg by mouth daily.   oxyCODONE-acetaminophen 10-325 MG tablet Commonly known as: PERCOCET Take 1 tablet by mouth 3 (three) times daily.   pantoprazole 40 MG tablet Commonly known as: PROTONIX Take 40 mg by mouth 2 (two) times daily as needed (indigestion).      Allergies  Allergen Reactions   Lisinopril Other (See Comments)    cough   Tramadol Itching   Follow-up Information    Truett Perna, MD Follow up in 2 week(s).   Specialty: Internal Medicine Why: hospital discharge follow up, repeat cbc/bmp, pcp to monitor anemia please  check your blood pressure at home , bring in blood pressure records for your doctor to review, futher blood pressue meds adjustment per pcp Contact information: [address] [place] [address] [phone]        [place] Follow up.   Specialty: Pulmonary Disease Why: chronic cough Contact information: [address] [place] [address] [phone]        Mirian Mo., MD Follow up.   Specialty: Gastroenterology Why: abnormal liver funciton, can call GI Dr Matthias Hughs if questions regarding abnormal liver function   Contact information: [address] [place] [address] [phone]        Noel Christmas, MD Follow up.   Specialty: Urology Why: if you have urinary difficulties  Contact information: [address] [place] [address] [phone]        Barnabas Lister, MD Follow up in 2 week(s).   Specialty: Nephrology Why: to monitor kidney function  and anemia Contact information: [address] [place] [address] [phone]            The results of significant diagnostics from this hospitalization (including imaging, microbiology, ancillary and laboratory) are listed below for reference.    Significant Diagnostic Studies: Dg Chest Portable 1 View  Result Date: 03/02/2019 CLINICAL DATA:  Nonproductive cough, congestion. EXAM: PORTABLE CHEST 1 VIEW COMPARISON:  Chest x-ray dated 03/17/2015. FINDINGS: Heart size and mediastinal contours are stable. Lungs are clear. No pleural effusion or pneumothorax seen. Osseous structures about the chest are unremarkable. IMPRESSION: No active disease. No evidence of pneumonia or pulmonary edema. Electronically Signed   By: Bary Richard M.D.   On: 03/02/2019 12:04   US Abdomen Limited Ruq  Result Date: 03/02/2019 CLINICAL DATA:  Bilirubinemia.  History pancreatitis. EXAM: ULTRASOUND ABDOMEN LIMITED RIGHT UPPER  QUADRANT COMPARISON:  CT 03/21/2017 FINDINGS: Gallbladder: Moderate gallbladder sludge with mild gallbladder wall thickening measuring 4.7 mm. Small amount of pericholecystic fluid. 6 mm gallbladder polyp is present. Initially measured focal echogenic material along the gallbladder wall was noted to move during the exam compatible with sludge. Common bile duct: Diameter: 2.8 mm. Liver: Mild increased parenchymal echogenicity with slight contour nodularity suggesting cirrhosis. Portal vein is patent on color Doppler imaging with normal direction of blood flow towards the liver. IMPRESSION: Gallbladder sludge with wall thickening measuring 4.7 mm and mild pericholecystic fluid. No definite stones visualized. Recommend clinical correlation for acute cholecystitis. HIDA scan may be helpful for further evaluation. 6  mm gallbladder polyp. Findings suggesting mild cirrhosis. Electronically Signed   By: Elberta Fortis M.D.   On: 03/02/2019 17:15    Microbiology: Recent Results (from the past 240 hour(s))  Urine culture     Status: Abnormal   Collection Time: 03/02/19 12:23 PM   Specimen: Urine, Random  Result Value Ref Range Status   Specimen Description   Final    URINE, RANDOM Performed at [facility], [address] [place], [place] [address]    Special Requests   Final    NONE Performed at [facility], [address] [place], [place] [address]    Culture >=100,000 COLONIES/mL STAPHYLOCOCCUS EPIDERMIDIS (A)  Final   Report Status 03/04/2019 FINAL  Final   Organism ID, Bacteria STAPHYLOCOCCUS EPIDERMIDIS (A)  Final      Susceptibility   Staphylococcus epidermidis - MIC*    CIPROFLOXACIN >=8 RESISTANT Resistant     GENTAMICIN <=0.5 SENSITIVE Sensitive     NITROFURANTOIN <=16 SENSITIVE Sensitive     OXACILLIN >=4 RESISTANT Resistant     TETRACYCLINE <=1 SENSITIVE Sensitive     VANCOMYCIN <=0.5 SENSITIVE Sensitive     TRIMETH/SULFA <=10 SENSITIVE Sensitive     CLINDAMYCIN  RESISTANT Resistant     RIFAMPIN <=0.5 SENSITIVE Sensitive     Inducible Clindamycin POSITIVE Resistant     * >=100,000 COLONIES/mL STAPHYLOCOCCUS EPIDERMIDIS  SARS Coronavirus 2 (Performed in [facility] hospital lab)     Status: None   Collection Time: 03/02/19  2:45 PM   Specimen: Nasopharyngeal Swab  Result Value Ref Range Status   SARS Coronavirus 2 NEGATIVE NEGATIVE Final    Comment: (NOTE) If result is NEGATIVE SARS-CoV-2 target nucleic acids are NOT DETECTED. The SARS-CoV-2 RNA is generally detectable in upper and lower  respiratory specimens during the acute phase of infection. The lowest  concentration of SARS-CoV-2 viral copies this assay can detect is 250  copies / mL. A negative result does not preclude SARS-CoV-2 infection  and should not be used as the sole basis for treatment or other  patient management decisions.  A negative result may occur with  improper specimen collection / handling, submission of specimen other  than nasopharyngeal swab, presence of viral mutation(s) within the  areas targeted by this assay, and inadequate number of viral copies  (<250 copies / mL). A negative result must be combined with clinical  observations, patient history, and epidemiological information. If result is POSITIVE SARS-CoV-2 target nucleic acids are DETECTED. The SARS-CoV-2 RNA is generally detectable in upper and lower  respiratory specimens dur ing the acute phase of infection.  Positive  results are indicative of active infection with SARS-CoV-2.  Clinical  correlation with patient history and other diagnostic information is  necessary to determine patient infection status.  Positive results do  not rule out bacterial infection or co-infection with other viruses. If result is PRESUMPTIVE POSTIVE SARS-CoV-2 nucleic acids MAY BE PRESENT.   A presumptive positive result was obtained on the submitted specimen  and confirmed on repeat testing.  While 2019 novel coronavirus    (SARS-CoV-2) nucleic acids may be present in the submitted sample  additional confirmatory testing may be necessary for epidemiological  and / or clinical management purposes  to differentiate between  SARS-CoV-2 and other Sarbecovirus currently known to infect humans.  If clinically indicated additional testing with an alternate test  methodology [phone]) is advised. The SARS-CoV-2 RNA is generally  detectable in upper and lower respiratory  sp ecimens during the acute  phase of infection. The expected result is Negative. Fact Sheet for Patients:  BoilerBrush.com.cyhttps://www.fda.gov/media/136312/download Fact Sheet for Healthcare Providers: https://pope.com/https://www.fda.gov/media/136313/download This test is not yet approved or cleared by the [place] FDA and has been authorized for detection and/or diagnosis of SARS-CoV-2 by FDA under an Emergency Use Authorization (EUA).  This EUA will remain in effect (meaning this test can be used) for the duration of the COVID-19 declaration under Section 564(b)(1) of the Act, 21 U.S.C. section 360bbb-3(b)(1), unless the authorization is terminated or revoked sooner. Performed at [facility] [place], [address] [place], [place] [address]   SARS Coronavirus 2 (CEPHEID - Performed in [facility] hospital lab), Hosp Order     Status: None   Collection Time: 03/02/19 11:56 PM   Specimen: Nasopharyngeal Swab  Result Value Ref Range Status   SARS Coronavirus 2 NEGATIVE NEGATIVE Final    Comment: (NOTE) If result is NEGATIVE SARS-CoV-2 target nucleic acids are NOT DETECTED. The SARS-CoV-2 RNA is generally detectable in upper and lower  respiratory specimens during the acute phase of infection. The lowest  concentration of SARS-CoV-2 viral copies this assay can detect is 250  copies / mL. A negative result does not preclude SARS-CoV-2 infection  and should not be used as the sole basis for treatment or other  patient management decisions.  A negative result may  occur with  improper specimen collection / handling, submission of specimen other  than nasopharyngeal swab, presence of viral mutation(s) within the  areas targeted by this assay, and inadequate number of viral copies  (<250 copies / mL). A negative result must be combined with clinical  observations, patient history, and epidemiological information. If result is POSITIVE SARS-CoV-2 target nucleic acids are DETECTED. The SARS-CoV-2 RNA is generally detectable in upper and lower  respiratory specimens dur ing the acute phase of infection.  Positive  results are indicative of active infection with SARS-CoV-2.  Clinical  correlation with patient history and other diagnostic information is  necessary to determine patient infection status.  Positive results do  not rule out bacterial infection or co-infection with other viruses. If result is PRESUMPTIVE POSTIVE SARS-CoV-2 nucleic acids MAY BE PRESENT.   A presumptive positive result was obtained on the submitted specimen  and confirmed on repeat testing.  While 2019 novel coronavirus  (SARS-CoV-2) nucleic acids may be present in the submitted sample  additional confirmatory testing may be necessary for epidemiological  and / or clinical management purposes  to differentiate between  SARS-CoV-2 and other Sarbecovirus currently known to infect humans.  If clinically indicated additional testing with an alternate test  methodology [phone]) is advised. The SARS-CoV-2 RNA is generally  detectable in upper and lower respiratory sp ecimens during the acute  phase of infection. The expected result is Negative. Fact Sheet for Patients:  BoilerBrush.com.cyhttps://www.fda.gov/media/136312/download Fact Sheet for Healthcare Providers: https://pope.com/https://www.fda.gov/media/136313/download This test is not yet approved or cleared by the [place] FDA and has been authorized for detection and/or diagnosis of SARS-CoV-2 by FDA under an Emergency Use Authorization (EUA).  This  EUA will remain in effect (meaning this test can be used) for the duration of the COVID-19 declaration under Section 564(b)(1) of the Act, 21 U.S.C. section 360bbb-3(b)(1), unless the authorization is terminated or revoked sooner. Performed at [facility], [address] [place], [place] [address]      Labs: Basic Metabolic Panel: Recent Labs  Lab 03/02/19 1223 03/03/19 0440 03/04/19 0503  NA 137 138 137  K 4.6 4.2 4.0  CL 109 112* 112*  CO2 16* 20* 15*  GLUCOSE 126* 90 91  BUN 26* 31* 27*  CREATININE 1.90* 1.89*   1.95* 1.83*  CALCIUM 8.9 8.4* 8.6*   Liver Function Tests: Recent Labs  Lab 03/02/19 1223 03/03/19 0440 03/04/19 0503  AST 106* 87* 104*  ALT 28 26 26   ALKPHOS 164* 142* 150*  BILITOT 24.3* 22.7* 23.2*  PROT 7.3 6.5 6.5  ALBUMIN 2.3* 2.1* 2.1*   Recent Labs  Lab 03/02/19 1223  LIPASE 73*   Recent Labs  Lab 03/02/19 1315  AMMONIA 58*   CBC: Recent Labs  Lab 03/02/19 1223 03/04/19 0503  WBC 6.6 5.7  NEUTROABS 4.0 3.2  HGB 9.8* 8.7*  HCT 29.1* 26.2*  MCV 105.4* 108.7*  PLT 213 199   Cardiac Enzymes: No results for input(s): CKTOTAL, CKMB, CKMBINDEX, TROPONINI in the last 168 hours. BNP: BNP (last 3 results) No results for input(s): BNP in the last 8760 hours.  ProBNP (last 3 results) No results for input(s): PROBNP in the last 8760 hours.  CBG: No results for input(s): GLUCAP in the last 168 hours.     Signed:  Florencia Reasons MD, PhD  [facility] 03/04/2019, 12:14 PM

## 2019-03-05 LAB — HEPATITIS PANEL, ACUTE
HCV Ab: <0.1 s/co ratio (ref 0.0–0.9)
Hep A IgM: NEGATIVE
Hep B C IgM: NEGATIVE
Hepatitis B Surface Ag: NEGATIVE

## 2019-07-13 DEATH — deceased
